# Patient Record
Sex: Female | Born: 1935 | Race: White | Hispanic: No | Smoking: Former smoker
Health system: Southern US, Community
[De-identification: ages and names within clinical notes are randomized; demographics above are authoritative.]

## PROBLEM LIST (undated history)

## (undated) DIAGNOSIS — I639 Cerebral infarction, unspecified: Secondary | ICD-10-CM

## (undated) DIAGNOSIS — Z952 Presence of prosthetic heart valve: Secondary | ICD-10-CM

## (undated) DIAGNOSIS — M353 Polymyalgia rheumatica: Secondary | ICD-10-CM

## (undated) DIAGNOSIS — E785 Hyperlipidemia, unspecified: Secondary | ICD-10-CM

## (undated) DIAGNOSIS — I509 Heart failure, unspecified: Secondary | ICD-10-CM

## (undated) DIAGNOSIS — I4891 Unspecified atrial fibrillation: Secondary | ICD-10-CM

## (undated) DIAGNOSIS — D649 Anemia, unspecified: Secondary | ICD-10-CM

## (undated) DIAGNOSIS — I739 Peripheral vascular disease, unspecified: Secondary | ICD-10-CM

## (undated) HISTORY — PX: CARDIAC VALVE REPLACEMENT: SHX585

---

## 1998-03-06 ENCOUNTER — Ambulatory Visit (HOSPITAL_COMMUNITY): Admission: RE | Admit: 1998-03-06 | Discharge: 1998-03-06 | Payer: Self-pay

## 1999-03-11 ENCOUNTER — Ambulatory Visit (HOSPITAL_COMMUNITY): Admission: RE | Admit: 1999-03-11 | Discharge: 1999-03-11 | Payer: Self-pay | Admitting: Unknown Physician Specialty

## 1999-03-11 ENCOUNTER — Encounter: Payer: Self-pay | Admitting: Unknown Physician Specialty

## 2000-04-27 ENCOUNTER — Ambulatory Visit (HOSPITAL_COMMUNITY): Admission: RE | Admit: 2000-04-27 | Discharge: 2000-04-27 | Payer: Self-pay | Admitting: Unknown Physician Specialty

## 2000-04-27 ENCOUNTER — Encounter: Payer: Self-pay | Admitting: Unknown Physician Specialty

## 2001-05-31 ENCOUNTER — Ambulatory Visit (HOSPITAL_COMMUNITY): Admission: RE | Admit: 2001-05-31 | Discharge: 2001-05-31 | Payer: Self-pay | Admitting: Unknown Physician Specialty

## 2001-05-31 ENCOUNTER — Encounter: Payer: Self-pay | Admitting: Unknown Physician Specialty

## 2002-06-10 ENCOUNTER — Ambulatory Visit (HOSPITAL_COMMUNITY): Admission: RE | Admit: 2002-06-10 | Discharge: 2002-06-10 | Payer: Self-pay

## 2002-06-10 ENCOUNTER — Encounter: Payer: Self-pay | Admitting: Internal Medicine

## 2003-07-06 ENCOUNTER — Ambulatory Visit (HOSPITAL_COMMUNITY): Admission: RE | Admit: 2003-07-06 | Discharge: 2003-07-06 | Payer: Self-pay | Admitting: Internal Medicine

## 2003-07-11 ENCOUNTER — Encounter: Admission: RE | Admit: 2003-07-11 | Discharge: 2003-07-11 | Payer: Self-pay | Admitting: Internal Medicine

## 2004-03-20 ENCOUNTER — Emergency Department: Payer: Self-pay | Admitting: Emergency Medicine

## 2004-03-20 ENCOUNTER — Other Ambulatory Visit: Payer: Self-pay

## 2004-04-11 ENCOUNTER — Ambulatory Visit: Payer: Self-pay | Admitting: Internal Medicine

## 2004-05-15 ENCOUNTER — Encounter: Admission: RE | Admit: 2004-05-15 | Discharge: 2004-05-15 | Payer: Self-pay | Admitting: Internal Medicine

## 2004-11-05 ENCOUNTER — Encounter: Admission: RE | Admit: 2004-11-05 | Discharge: 2004-11-05 | Payer: Self-pay | Admitting: Internal Medicine

## 2004-12-11 ENCOUNTER — Ambulatory Visit: Payer: Self-pay | Admitting: Surgery

## 2005-08-12 ENCOUNTER — Other Ambulatory Visit: Payer: Self-pay

## 2005-08-12 ENCOUNTER — Inpatient Hospital Stay: Payer: Self-pay | Admitting: Internal Medicine

## 2005-08-13 ENCOUNTER — Other Ambulatory Visit: Payer: Self-pay

## 2006-04-17 ENCOUNTER — Ambulatory Visit: Payer: Self-pay | Admitting: Internal Medicine

## 2006-05-15 ENCOUNTER — Ambulatory Visit: Payer: Self-pay | Admitting: Internal Medicine

## 2006-12-10 ENCOUNTER — Ambulatory Visit: Payer: Self-pay | Admitting: Internal Medicine

## 2008-01-12 ENCOUNTER — Ambulatory Visit: Payer: Self-pay | Admitting: Internal Medicine

## 2009-02-06 ENCOUNTER — Ambulatory Visit: Payer: Self-pay | Admitting: Internal Medicine

## 2010-01-24 ENCOUNTER — Ambulatory Visit: Payer: Self-pay | Admitting: Ophthalmology

## 2010-02-04 ENCOUNTER — Ambulatory Visit: Payer: Self-pay | Admitting: Ophthalmology

## 2010-04-08 ENCOUNTER — Ambulatory Visit: Payer: Self-pay | Admitting: Ophthalmology

## 2011-04-16 ENCOUNTER — Ambulatory Visit: Payer: Self-pay | Admitting: Internal Medicine

## 2014-12-27 ENCOUNTER — Emergency Department: Payer: Medicare Other

## 2014-12-27 ENCOUNTER — Emergency Department
Admission: EM | Admit: 2014-12-27 | Discharge: 2014-12-27 | Disposition: A | Discharge status: Home / Self Care | Payer: Medicare Other | Attending: Emergency Medicine | Admitting: Emergency Medicine

## 2014-12-27 ENCOUNTER — Encounter: Payer: Self-pay | Admitting: Medical Oncology

## 2014-12-27 DIAGNOSIS — W01198A Fall on same level from slipping, tripping and stumbling with subsequent striking against other object, initial encounter: Secondary | ICD-10-CM | POA: Insufficient documentation

## 2014-12-27 DIAGNOSIS — S6991XA Unspecified injury of right wrist, hand and finger(s), initial encounter: Secondary | ICD-10-CM | POA: Diagnosis not present

## 2014-12-27 DIAGNOSIS — S0083XA Contusion of other part of head, initial encounter: Secondary | ICD-10-CM

## 2014-12-27 DIAGNOSIS — S8991XA Unspecified injury of right lower leg, initial encounter: Secondary | ICD-10-CM | POA: Insufficient documentation

## 2014-12-27 DIAGNOSIS — Y9289 Other specified places as the place of occurrence of the external cause: Secondary | ICD-10-CM | POA: Insufficient documentation

## 2014-12-27 DIAGNOSIS — Y9389 Activity, other specified: Secondary | ICD-10-CM | POA: Diagnosis not present

## 2014-12-27 DIAGNOSIS — Y998 Other external cause status: Secondary | ICD-10-CM | POA: Diagnosis not present

## 2014-12-27 DIAGNOSIS — W19XXXA Unspecified fall, initial encounter: Secondary | ICD-10-CM

## 2014-12-27 DIAGNOSIS — S0990XA Unspecified injury of head, initial encounter: Secondary | ICD-10-CM | POA: Diagnosis present

## 2014-12-27 MED ORDER — ACETAMINOPHEN 325 MG PO TABS
650.0000 mg | ORAL_TABLET | Freq: Once | ORAL | Status: AC
  Administered 2014-12-27: 650 mg via ORAL

## 2014-12-27 MED ORDER — ACETAMINOPHEN 325 MG PO TABS
ORAL_TABLET | ORAL | Status: AC
  Administered 2014-12-27: 650 mg via ORAL
  Filled 2014-12-27: qty 2

## 2014-12-27 NOTE — ED Notes (Signed)
Pt was at the [facility] walking when she reports she tripped, pt has hematoma to rt side of forehead. Pt denies LOC. Pt A/O x 4. Also has skin tear to rt knee and bruise to rt hand.

## 2014-12-27 NOTE — ED Notes (Signed)
Patient transported to CT 

## 2014-12-27 NOTE — ED Provider Notes (Signed)
CSN: [id number]     Arrival date & time 12/27/14  1046 History   First MD Initiated Contact with Patient 12/27/14 1047     Chief Complaint  Patient presents with  . Fall     (Consider location/radiation/quality/duration/timing/severity/associated sxs/prior Treatment) The history is provided by the patient.  Savannah Friedman is a 81 y.o. female who presenting with fall. Patient states that she was working out at [employer] and that her left foot got stuck on something and she tripped and fell and hit her head. States that she knows that hematoma on the left forehead head as well as some pain of the right hand and right knee. Denies passing out or chest pain or syncope. She is healthy otherwise and does not take any other medications. Not on blood thinners. States that tetanus is about 10 years ago but refused another tdap shot.    History reviewed. No pertinent past medical history. Past Surgical History  Procedure Laterality Date  . Cardiac valve replacement     No family history on file. Social History  Substance Use Topics  . Smoking status: Never Smoker   . Smokeless tobacco: None  . Alcohol Use: No   OB History    No data available     Review of Systems  Skin: Positive for wound.       L forehead hematoma   All other systems reviewed and are negative.     Allergies  Codeine  Home Medications   Prior to Admission medications   Not on File   BP 159/88 mmHg  Pulse 90  Temp(Src) 97.7 F (36.5 C) (Oral)  Resp 20  Ht 5' (1.524 m)  Wt 133 lb (60.328 kg)  BMI 25.97 kg/m2  SpO2 99% Physical Exam  Constitutional: She is oriented to person, place, and time. She appears well-developed and well-nourished.  HENT:  Head: Normocephalic.  Mouth/Throat: Oropharynx is clear and moist.  L forehead hematoma. Extra ocular movements intact   Eyes: Conjunctivae are normal. Pupils are equal, round, and reactive to light.  Neck: Normal range of motion. Neck supple.   Cardiovascular: Normal rate, regular rhythm and normal heart sounds.   Pulmonary/Chest: Effort normal and breath sounds normal. No respiratory distress. She has no wheezes.  Abdominal: Soft. Bowel sounds are normal. She exhibits no distension. There is no tenderness. There is no rebound.  Musculoskeletal:  Abrasion R hand with mild tenderness. R knee skin tear with minimal tenderness, nl ROM.   Neurological: She is alert and oriented to person, place, and time.  Skin: Skin is warm and dry.  Psychiatric: She has a normal mood and affect. Her behavior is normal. Judgment and thought content normal.  Nursing note and vitals reviewed.   ED Course  Procedures (including critical care time) Labs Review Labs Reviewed - No data to display  Imaging Review Ct Head Wo Contrast  12/27/2014   CLINICAL DATA:  81 year old who tripped and fell while walking at the [facility] earlier today, sustaining a hematoma to the left forehead. Patient denies loss of consciousness. Initial encounter.  EXAM: CT HEAD WITHOUT CONTRAST  CT CERVICAL SPINE WITHOUT CONTRAST  TECHNIQUE: Multidetector CT imaging of the head and cervical spine was performed following the standard protocol without intravenous contrast. Multiplanar CT image reconstructions of the cervical spine were also generated.  COMPARISON:  None.  FINDINGS: CT HEAD FINDINGS  Moderate to severe age related cortical and deep atrophy. Mild cerebellar atrophy. Moderate changes of small vessel disease  of the white matter diffusely. Extensive calcification involving the choroid plexus of the lateral ventricles including the temporal horns, and involving the choroid plexus of the 4th ventricle extending into the foramina of Luschka. Physiologic calcifications in the basal ganglia bilaterally. No mass lesion. No midline shift. No acute hemorrhage or hematoma. No extra-axial fluid collections. No evidence of acute infarction.  Left frontal scalp hematoma and preseptal hematoma  anterior to the left orbit without underlying skull fracture. Visualized paranasal sinuses, bilateral mastoid air cells and bilateral middle ear cavities well-aerated. Severe bilateral carotid siphon atherosclerosis.  CT CERVICAL SPINE FINDINGS  No fractures identified involving the cervical spine. Severe osseous demineralization. Fusion of the vertebral bodies of C5 and C6. Anatomic posterior alignment on the sagittal reconstructed images. Facet joints intact throughout with severe diffuse degenerative changes. Disc space narrowing at C2-3, C4-5, and C6-7. Borderline spinal stenosis at C5-6. Coronal reformatted images demonstrate an intact craniocervical junction, intact C1-C2 articulation with degenerative changes, and intact lateral masses throughout. Uncinate and predominantly facet hypertrophy account for multilevel foraminal stenoses including severe left and moderate right C3-4, severe bilateral C4-5, moderate bilateral C5-6, moderate bilateral C6-7.  Note is made of mosaic attenuation involving the visualized lung apices.  IMPRESSION: 1. No acute intracranial abnormality. 2. Left frontal scalp hematoma without underlying skull fracture. 3. Moderate to severe age related generalized atrophy and moderate chronic microvascular ischemic changes of the white matter. 4. No cervical spine fractures identified. 5. Severe osseous demineralization of the cervical spine. 6. Multilevel degenerative disc disease, spondylosis and facet degenerative changes with multilevel foraminal stenoses as detailed above. 7. Fusion of the C5 and C6 vertebral bodies with borderline spinal stenosis at the C5-6 level. 8. Mosaic attenuation involving the visualized lung apices likely representing focal air trapping as is seen in patients with bronchitis and/or asthma.   Electronically Signed   By: Hulan Saas M.D.   On: 12/27/2014 12:06   Ct Cervical Spine Wo Contrast  12/27/2014   CLINICAL DATA:  81 year old who tripped and fell  while walking at the [facility] earlier today, sustaining a hematoma to the left forehead. Patient denies loss of consciousness. Initial encounter.  EXAM: CT HEAD WITHOUT CONTRAST  CT CERVICAL SPINE WITHOUT CONTRAST  TECHNIQUE: Multidetector CT imaging of the head and cervical spine was performed following the standard protocol without intravenous contrast. Multiplanar CT image reconstructions of the cervical spine were also generated.  COMPARISON:  None.  FINDINGS: CT HEAD FINDINGS  Moderate to severe age related cortical and deep atrophy. Mild cerebellar atrophy. Moderate changes of small vessel disease of the white matter diffusely. Extensive calcification involving the choroid plexus of the lateral ventricles including the temporal horns, and involving the choroid plexus of the 4th ventricle extending into the foramina of Luschka. Physiologic calcifications in the basal ganglia bilaterally. No mass lesion. No midline shift. No acute hemorrhage or hematoma. No extra-axial fluid collections. No evidence of acute infarction.  Left frontal scalp hematoma and preseptal hematoma anterior to the left orbit without underlying skull fracture. Visualized paranasal sinuses, bilateral mastoid air cells and bilateral middle ear cavities well-aerated. Severe bilateral carotid siphon atherosclerosis.  CT CERVICAL SPINE FINDINGS  No fractures identified involving the cervical spine. Severe osseous demineralization. Fusion of the vertebral bodies of C5 and C6. Anatomic posterior alignment on the sagittal reconstructed images. Facet joints intact throughout with severe diffuse degenerative changes. Disc space narrowing at C2-3, C4-5, and C6-7. Borderline spinal stenosis at C5-6. Coronal reformatted images demonstrate an intact craniocervical junction,  intact C1-C2 articulation with degenerative changes, and intact lateral masses throughout. Uncinate and predominantly facet hypertrophy account for multilevel foraminal stenoses including  severe left and moderate right C3-4, severe bilateral C4-5, moderate bilateral C5-6, moderate bilateral C6-7.  Note is made of mosaic attenuation involving the visualized lung apices.  IMPRESSION: 1. No acute intracranial abnormality. 2. Left frontal scalp hematoma without underlying skull fracture. 3. Moderate to severe age related generalized atrophy and moderate chronic microvascular ischemic changes of the white matter. 4. No cervical spine fractures identified. 5. Severe osseous demineralization of the cervical spine. 6. Multilevel degenerative disc disease, spondylosis and facet degenerative changes with multilevel foraminal stenoses as detailed above. 7. Fusion of the C5 and C6 vertebral bodies with borderline spinal stenosis at the C5-6 level. 8. Mosaic attenuation involving the visualized lung apices likely representing focal air trapping as is seen in patients with bronchitis and/or asthma.   Electronically Signed   By: Hulan Saas M.D.   On: 12/27/2014 12:06   Dg Knee Complete 4 Views Right  12/27/2014   CLINICAL DATA:  Anterior knee pain with swelling and bruising and abrasion secondary to a fall today.  EXAM: RIGHT KNEE - COMPLETE 4+ VIEW  COMPARISON:  None  FINDINGS: There is no fracture or dislocation. Slight medial joint space narrowing. Soft tissue swelling anterior to the lower pole of the patella. Small joint effusion.  IMPRESSION: No acute osseous abnormality. Soft tissue swelling. Small joint effusion.   Electronically Signed   By: Francene Boyers M.D.   On: 12/27/2014 11:32   Dg Hand Complete Right  12/27/2014   CLINICAL DATA:  81 year old who fell from a standing position onto a concrete floor and injured the right hand. Bruising and abrasion at the level of the 1st metacarpal.  EXAM: RIGHT HAND - COMPLETE 3+ VIEW  COMPARISON:  None.  FINDINGS: A ring obscures the mid and distal shaft of the proximal phalanx of the ring finger. No evidence of acute fracture or dislocation. Osseous  demineralization. Severe narrowing of IP joint spaces in the fingers and the thumb, worst at the PIP joint of the index finger where there is mild deformity. Narrowed joint spaces in the wrist, particularly the trapezium-1st metacarpal joint. Hydroxyapatite deposition disease adjacent to the trapezium-1st metacarpal joint.  IMPRESSION: 1. No acute osseous abnormality. 2. Severe osteoarthritis involving the IP joints of the fingers and thumbs, greatest at the PIP joint of the ring finger.   Electronically Signed   By: Hulan Saas M.D.   On: 12/27/2014 11:33   I have personally reviewed and evaluated these images and lab results as part of my medical decision-making.   EKG Interpretation None      MDM   Final diagnoses:  None    Savannah Friedman is a 81 y.o. female here with mechanical fall. Will get CT head/neck, xrays. No syncope or chest pain so will not need labs or EKG.  12:49 PM CT showed no fracture or bleed. Will dc home.       Richardean Canal, MD 12/27/14 1250

## 2014-12-27 NOTE — Discharge Instructions (Signed)
You are likely to have more bruising on the forehead. The skin will turn black and blue.   Stay hydrated.   See your doctor.   Return to ER if you have severe pain, vomiting.

## 2015-01-20 ENCOUNTER — Emergency Department
Admission: EM | Admit: 2015-01-20 | Discharge: 2015-01-20 | Disposition: A | Discharge status: Home / Self Care | Payer: Medicare Other | Attending: Emergency Medicine | Admitting: Emergency Medicine

## 2015-01-20 ENCOUNTER — Emergency Department: Payer: Medicare Other

## 2015-01-20 DIAGNOSIS — R220 Localized swelling, mass and lump, head: Secondary | ICD-10-CM | POA: Diagnosis present

## 2015-01-20 DIAGNOSIS — S0083XD Contusion of other part of head, subsequent encounter: Secondary | ICD-10-CM | POA: Diagnosis not present

## 2015-01-20 DIAGNOSIS — W1839XD Other fall on same level, subsequent encounter: Secondary | ICD-10-CM | POA: Diagnosis not present

## 2015-01-20 NOTE — ED Notes (Signed)
States she fell about 3 weeks ago  Hit her face   conts to have bruising and swelling to face. Denies new injury

## 2015-01-20 NOTE — ED Notes (Signed)
Pt reported a fall on Oct 5, was seen in ED. Pt injured left side of face and is concerned that the swelling and bruising has not improved. Denies any additional falls.

## 2015-01-20 NOTE — Discharge Instructions (Signed)
Hematoma  A hematoma is a collection of blood. The collection of blood can turn into a hard, painful lump under the skin. Your skin may turn blue or yellow if the hematoma is close to the surface of the skin. Most hematomas get better in a few days to weeks. Some hematomas are serious and need medical care. Hematomas can be very small or very big.  HOME CARE   Apply ice to the injured area:    Put ice in a plastic bag.    Place a towel between your skin and the bag.    Leave the ice on for 20 minutes, 2-3 times a day for the first 1 to 2 days.   After the first 2 days, switch to using warm packs on the injured area.   Raise (elevate) the injured area to lessen pain and puffiness (swelling). You may also wrap the area with an elastic bandage. Make sure the bandage is not wrapped too tight.   If you have a painful hematoma on your leg or foot, you may use crutches for a couple days.   Only take medicines as told by your doctor.  GET HELP RIGHT AWAY IF:    Your pain gets worse.   Your pain is not controlled with medicine.   You have a fever.   Your puffiness gets worse.   Your skin turns more blue or yellow.   Your skin over the hematoma breaks or starts bleeding.   Your hematoma is in your chest or belly (abdomen) and you are short of breath, feel weak, or have a change in consciousness.   Your hematoma is on your scalp and you have a headache that gets worse or a change in alertness or consciousness.  MAKE SURE YOU:    Understand these instructions.   Will watch your condition.   Will get help right away if you are not doing well or get worse.     This information is not intended to replace advice given to you by your health care provider. Make sure you discuss any questions you have with your health care provider.     Document Released: 04/17/2004 Document Revised: 11/10/2012 Document Reviewed: 08/18/2012  Elsevier Interactive Patient Education 2016 [employer]

## 2015-01-20 NOTE — ED Notes (Signed)
Pt verbalizes understanding of discharge instructions.

## 2015-01-20 NOTE — ED Provider Notes (Signed)
CSN: [id number]     Arrival date & time 01/20/15  1011 History   First MD Initiated Contact with Patient 01/20/15 1044     Chief Complaint  Patient presents with  . Facial Swelling      HPI Comments: 81 year old female presents today complaining of persistent left sided facial swelling secondary to a fall that occurred on 12/27/14. Pt reports on that date she was at the [facility] and had a mechanical fall, injuring her right hand, knee and bruising the left side of her face/forehead. She was evaluated on 12/27/14 in the ER by Dr. Silverio Lay and had a negative CT of her head/neck. She is concerned she may have a blood clot in her face. Has not had any headache or dizziness, area of hematoma is tender.   No past medical history on file. Past Surgical History  Procedure Laterality Date  . Cardiac valve replacement     History reviewed. No pertinent family history. Social History  Substance Use Topics  . Smoking status: Never Smoker   . Smokeless tobacco: None  . Alcohol Use: No   OB History    No data available     Review of Systems  Skin: Positive for wound.  All other systems reviewed and are negative.     Allergies  Codeine  Home Medications   Prior to Admission medications   Not on File   BP 160/87 mmHg  Pulse 85  Temp(Src) 97.5 F (36.4 C) (Oral)  Ht 5' (1.524 m)  Wt 133 lb (60.328 kg)  BMI 25.97 kg/m2  SpO2 98% Physical Exam  Constitutional: She is oriented to person, place, and time. Vital signs are normal. She appears well-developed and well-nourished. She is active.  Non-toxic appearance. She does not have a sickly appearance. She does not appear ill.  HENT:  Head: Normocephalic.  Right Ear: Tympanic membrane and external ear normal.  Left Ear: Tympanic membrane and external ear normal.  Nose: Nose normal.  Mouth/Throat: Uvula is midline, oropharynx is clear and moist and mucous membranes are normal.  Left forehead with small hematoma. Left eye and cheek there is  ecchymosis, no swelling  Eyes: Conjunctivae and EOM are normal. Pupils are equal, round, and reactive to light.  Left orbital floor TTP  Neck: Normal range of motion. Neck supple.  No cervical spine tenderness.  Neurological: She is alert and oriented to person, place, and time. No cranial nerve deficit. She exhibits normal muscle tone. Coordination normal.  Skin: Skin is warm and dry.  Psychiatric: She has a normal mood and affect. Her behavior is normal. Judgment and thought content normal.  Nursing note and vitals reviewed.   ED Course  Procedures (including critical care time) Labs Review Labs Reviewed - No data to display  Imaging Review Ct Maxillofacial Wo Cm  01/20/2015  CLINICAL DATA:  Fall with injury to LEFT side of face on January 06, 2015. EXAM: CT MAXILLOFACIAL WITHOUT CONTRAST TECHNIQUE: Multidetector CT imaging of the maxillofacial structures was performed. Multiplanar CT image reconstructions were also generated. A small metallic BB was placed on the right temple in order to reliably differentiate right from left. COMPARISON:  Head CT 12/27/2014 FINDINGS: The orbital walls are intact. The intraconal contents are normal. Globes are normal. No nasal bone fracture. No fluid frontal sinus. The zygomatic arches are intact. No fracture of the walls of the maxillary sinus. The pterygoid plates are intact. The mandibular condyles are located. No mandibular fracture no fluid in the  paranasal sinuses. IMPRESSION: No facial bone fracture. Electronically Signed   By: Genevive BiStewart  Edmunds M.D.   On: 01/20/2015 11:08   I have personally reviewed and evaluated these images and lab results as part of my medical decision-making.   EKG Interpretation None      MDM  Reviewed notes from evaluation on 12/27/14 - negative CT of head and cervical spine. Due to orbital floor tenderness concerned for possible facial fracture. Will eval with CT of facial bone. Reassured pt I am not concerned she has a  blood clot, likely has persistent hematoma.  Negative CT facial bones. Discussed with patient she would likely have swelling for a few more weeks. Continue supportive care with ice, motrin  Final diagnoses:  Traumatic hematoma of forehead, subsequent encounter        Luvenia Reddenmma Weavil V, PA-C 01/20/15 1116  Phineas SemenGraydon Goodman, MD 01/20/15 1133

## 2015-02-17 ENCOUNTER — Inpatient Hospital Stay: Admit: 2015-02-17 | Payer: Medicare Other

## 2015-02-17 ENCOUNTER — Encounter: Payer: Self-pay | Admitting: Emergency Medicine

## 2015-02-17 ENCOUNTER — Inpatient Hospital Stay
Admission: EM | Admit: 2015-02-17 | Discharge: 2015-02-23 | DRG: 193 | Disposition: A | Discharge status: Skilled Nursing Facility | Payer: Medicare Other | Attending: Internal Medicine | Admitting: Internal Medicine

## 2015-02-17 ENCOUNTER — Emergency Department: Payer: Medicare Other

## 2015-02-17 DIAGNOSIS — G959 Disease of spinal cord, unspecified: Secondary | ICD-10-CM | POA: Diagnosis present

## 2015-02-17 DIAGNOSIS — D696 Thrombocytopenia, unspecified: Secondary | ICD-10-CM | POA: Diagnosis present

## 2015-02-17 DIAGNOSIS — Z79899 Other long term (current) drug therapy: Secondary | ICD-10-CM | POA: Diagnosis not present

## 2015-02-17 DIAGNOSIS — W19XXXA Unspecified fall, initial encounter: Secondary | ICD-10-CM | POA: Diagnosis present

## 2015-02-17 DIAGNOSIS — J9601 Acute respiratory failure with hypoxia: Secondary | ICD-10-CM | POA: Diagnosis present

## 2015-02-17 DIAGNOSIS — Z952 Presence of prosthetic heart valve: Secondary | ICD-10-CM | POA: Diagnosis not present

## 2015-02-17 DIAGNOSIS — J209 Acute bronchitis, unspecified: Secondary | ICD-10-CM | POA: Diagnosis present

## 2015-02-17 DIAGNOSIS — I340 Nonrheumatic mitral (valve) insufficiency: Secondary | ICD-10-CM | POA: Diagnosis present

## 2015-02-17 DIAGNOSIS — M542 Cervicalgia: Secondary | ICD-10-CM

## 2015-02-17 DIAGNOSIS — I5031 Acute diastolic (congestive) heart failure: Secondary | ICD-10-CM | POA: Diagnosis present

## 2015-02-17 DIAGNOSIS — Z7901 Long term (current) use of anticoagulants: Secondary | ICD-10-CM | POA: Diagnosis not present

## 2015-02-17 DIAGNOSIS — G8191 Hemiplegia, unspecified affecting right dominant side: Secondary | ICD-10-CM | POA: Diagnosis present

## 2015-02-17 DIAGNOSIS — M25569 Pain in unspecified knee: Secondary | ICD-10-CM

## 2015-02-17 DIAGNOSIS — J189 Pneumonia, unspecified organism: Principal | ICD-10-CM | POA: Diagnosis present

## 2015-02-17 DIAGNOSIS — G20 Parkinson's disease: Secondary | ICD-10-CM | POA: Diagnosis present

## 2015-02-17 DIAGNOSIS — Z7982 Long term (current) use of aspirin: Secondary | ICD-10-CM | POA: Diagnosis not present

## 2015-02-17 DIAGNOSIS — G8929 Other chronic pain: Secondary | ICD-10-CM | POA: Diagnosis present

## 2015-02-17 DIAGNOSIS — I6522 Occlusion and stenosis of left carotid artery: Secondary | ICD-10-CM | POA: Diagnosis present

## 2015-02-17 DIAGNOSIS — I639 Cerebral infarction, unspecified: Secondary | ICD-10-CM | POA: Diagnosis present

## 2015-02-17 DIAGNOSIS — R05 Cough: Secondary | ICD-10-CM

## 2015-02-17 DIAGNOSIS — J45909 Unspecified asthma, uncomplicated: Secondary | ICD-10-CM | POA: Diagnosis present

## 2015-02-17 DIAGNOSIS — M25551 Pain in right hip: Secondary | ICD-10-CM | POA: Diagnosis present

## 2015-02-17 DIAGNOSIS — M549 Dorsalgia, unspecified: Secondary | ICD-10-CM | POA: Diagnosis present

## 2015-02-17 DIAGNOSIS — R531 Weakness: Secondary | ICD-10-CM

## 2015-02-17 DIAGNOSIS — D649 Anemia, unspecified: Secondary | ICD-10-CM | POA: Diagnosis present

## 2015-02-17 DIAGNOSIS — I6529 Occlusion and stenosis of unspecified carotid artery: Secondary | ICD-10-CM

## 2015-02-17 DIAGNOSIS — Z885 Allergy status to narcotic agent status: Secondary | ICD-10-CM

## 2015-02-17 DIAGNOSIS — R059 Cough, unspecified: Secondary | ICD-10-CM

## 2015-02-17 HISTORY — DX: Anemia, unspecified: D64.9

## 2015-02-17 LAB — URINALYSIS COMPLETE WITH MICROSCOPIC (ARMC ONLY)
BILIRUBIN URINE: NEGATIVE
Glucose, UA: NEGATIVE mg/dL
Hgb urine dipstick: NEGATIVE
Leukocytes, UA: NEGATIVE
Nitrite: NEGATIVE
PH: 6 (ref 5.0–8.0)
PROTEIN: 30 mg/dL — AB
Specific Gravity, Urine: 1.021 (ref 1.005–1.030)

## 2015-02-17 LAB — CBC WITH DIFFERENTIAL/PLATELET
Basophils Absolute: 0 10*3/uL (ref 0–0.1)
Basophils Relative: 0 %
EOS PCT: 0 %
Eosinophils Absolute: 0 10*3/uL (ref 0–0.7)
HCT: 39.7 % (ref 35.0–47.0)
Hemoglobin: 13.3 g/dL (ref 12.0–16.0)
LYMPHS ABS: 0.3 10*3/uL — AB (ref 1.0–3.6)
LYMPHS PCT: 8 %
MCH: 31.3 pg (ref 26.0–34.0)
MCHC: 33.5 g/dL (ref 32.0–36.0)
MCV: 93.6 fL (ref 80.0–100.0)
MONO ABS: 0.3 10*3/uL (ref 0.2–0.9)
MONOS PCT: 9 %
NEUTROS ABS: 3.1 10*3/uL (ref 1.4–6.5)
Neutrophils Relative %: 83 %
PLATELETS: 137 10*3/uL — AB (ref 150–440)
RBC: 4.24 MIL/uL (ref 3.80–5.20)
RDW: 13.7 % (ref 11.5–14.5)
WBC: 3.8 10*3/uL (ref 3.6–11.0)

## 2015-02-17 LAB — COMPREHENSIVE METABOLIC PANEL
ALBUMIN: 4 g/dL (ref 3.5–5.0)
ALK PHOS: 58 U/L (ref 38–126)
ALT: 10 U/L — AB (ref 14–54)
ANION GAP: 9 (ref 5–15)
AST: 22 U/L (ref 15–41)
BUN: 18 mg/dL (ref 6–20)
CALCIUM: 8.4 mg/dL — AB (ref 8.9–10.3)
CHLORIDE: 100 mmol/L — AB (ref 101–111)
CO2: 26 mmol/L (ref 22–32)
Creatinine, Ser: 0.73 mg/dL (ref 0.44–1.00)
GFR calc Af Amer: >60 mL/min (ref >60)
GFR calc non Af Amer: >60 mL/min (ref >60)
GLUCOSE: 110 mg/dL — AB (ref 65–99)
Potassium: 3.8 mmol/L (ref 3.5–5.1)
SODIUM: 135 mmol/L (ref 135–145)
Total Bilirubin: 1 mg/dL (ref 0.3–1.2)
Total Protein: 6.5 g/dL (ref 6.5–8.1)

## 2015-02-17 LAB — PHOSPHORUS: PHOSPHORUS: 3 mg/dL (ref 2.5–4.6)

## 2015-02-17 LAB — LIPASE, BLOOD: Lipase: 31 U/L (ref 11–51)

## 2015-02-17 LAB — TROPONIN I

## 2015-02-17 LAB — MAGNESIUM: Magnesium: 1.8 mg/dL (ref 1.7–2.4)

## 2015-02-17 MED ORDER — ONDANSETRON HCL 4 MG PO TABS: 4.0000 mg | ORAL_TABLET | Freq: Four times a day (QID) | ORAL | Status: DC | PRN

## 2015-02-17 MED ORDER — CEFTRIAXONE SODIUM 1 G IJ SOLR
1.0000 g | Freq: Once | INTRAMUSCULAR | Status: AC
  Administered 2015-02-17: 1 g via INTRAVENOUS
  Filled 2015-02-17: qty 10

## 2015-02-17 MED ORDER — DEXTROSE 5 % IV SOLN
500.0000 mg | Freq: Once | INTRAVENOUS | Status: AC
  Administered 2015-02-17: 500 mg via INTRAVENOUS
  Filled 2015-02-17: qty 500

## 2015-02-17 MED ORDER — LEVOFLOXACIN IN D5W 500 MG/100ML IV SOLN
500.0000 mg | Freq: Once | INTRAVENOUS | Status: AC
  Administered 2015-02-18: 500 mg via INTRAVENOUS
  Filled 2015-02-17: qty 100

## 2015-02-17 MED ORDER — SODIUM CHLORIDE 0.9 % IV BOLUS (SEPSIS)
1000.0000 mL | Freq: Once | INTRAVENOUS | Status: AC
  Administered 2015-02-17: 1000 mL via INTRAVENOUS

## 2015-02-17 MED ORDER — ENOXAPARIN SODIUM 40 MG/0.4ML ~~LOC~~ SOLN
40.0000 mg | SUBCUTANEOUS | Status: DC
  Administered 2015-02-17: 40 mg via SUBCUTANEOUS
  Filled 2015-02-17: qty 0.4

## 2015-02-17 MED ORDER — ACETAMINOPHEN 650 MG RE SUPP: 650.0000 mg | Freq: Four times a day (QID) | RECTAL | Status: DC | PRN

## 2015-02-17 MED ORDER — FUROSEMIDE 10 MG/ML IJ SOLN
40.0000 mg | Freq: Once | INTRAMUSCULAR | Status: AC
  Administered 2015-02-17: 40 mg via INTRAVENOUS
  Filled 2015-02-17: qty 4

## 2015-02-17 MED ORDER — IPRATROPIUM-ALBUTEROL 0.5-2.5 (3) MG/3ML IN SOLN
3.0000 mL | RESPIRATORY_TRACT | Status: DC
  Administered 2015-02-17 – 2015-02-20 (×14): 3 mL via RESPIRATORY_TRACT
  Filled 2015-02-17 (×17): qty 3

## 2015-02-17 MED ORDER — ONDANSETRON HCL 4 MG/2ML IJ SOLN
4.0000 mg | Freq: Four times a day (QID) | INTRAMUSCULAR | Status: DC | PRN
  Administered 2015-02-20: 4 mg via INTRAVENOUS
  Filled 2015-02-17: qty 2

## 2015-02-17 MED ORDER — BISACODYL 5 MG PO TBEC
5.0000 mg | DELAYED_RELEASE_TABLET | Freq: Every day | ORAL | Status: DC | PRN
  Administered 2015-02-20 – 2015-02-21 (×2): 5 mg via ORAL
  Filled 2015-02-17 (×2): qty 1

## 2015-02-17 MED ORDER — HYDROCODONE-ACETAMINOPHEN 5-325 MG PO TABS
1.0000 | ORAL_TABLET | ORAL | Status: DC | PRN
  Administered 2015-02-19: 1 via ORAL
  Administered 2015-02-19 – 2015-02-20 (×2): 2 via ORAL
  Administered 2015-02-20: 1 via ORAL
  Administered 2015-02-20 (×2): 2 via ORAL
  Administered 2015-02-22 (×2): 1 via ORAL
  Administered 2015-02-23 (×2): 2 via ORAL
  Filled 2015-02-17: qty 1
  Filled 2015-02-17 (×7): qty 2
  Filled 2015-02-17 (×2): qty 1

## 2015-02-17 MED ORDER — FUROSEMIDE 10 MG/ML IJ SOLN
20.0000 mg | Freq: Two times a day (BID) | INTRAMUSCULAR | Status: DC
  Administered 2015-02-18: 20 mg via INTRAVENOUS
  Filled 2015-02-17: qty 2

## 2015-02-17 MED ORDER — ALUM & MAG HYDROXIDE-SIMETH 200-200-20 MG/5ML PO SUSP
30.0000 mL | Freq: Four times a day (QID) | ORAL | Status: DC | PRN
  Administered 2015-02-21 – 2015-02-23 (×4): 30 mL via ORAL
  Filled 2015-02-17 (×4): qty 30

## 2015-02-17 MED ORDER — SENNOSIDES-DOCUSATE SODIUM 8.6-50 MG PO TABS: 1.0000 | ORAL_TABLET | Freq: Every evening | ORAL | Status: DC | PRN

## 2015-02-17 MED ORDER — ACETAMINOPHEN 325 MG PO TABS
650.0000 mg | ORAL_TABLET | Freq: Four times a day (QID) | ORAL | Status: DC | PRN
  Administered 2015-02-19 – 2015-02-20 (×2): 650 mg via ORAL
  Filled 2015-02-17 (×3): qty 2

## 2015-02-17 MED ORDER — LEVOFLOXACIN IN D5W 250 MG/50ML IV SOLN
250.0000 mg | INTRAVENOUS | Status: AC
  Administered 2015-02-19 – 2015-02-21 (×3): 250 mg via INTRAVENOUS
  Filled 2015-02-17 (×3): qty 50

## 2015-02-17 NOTE — Progress Notes (Signed)
ANTIBIOTIC CONSULT NOTE - INITIAL  Pharmacy Consult for Levaquin Indication: pneumonia  Allergies  Allergen Reactions  . Codeine Nausea And Vomiting    Patient Measurements: Height: 5\' 1"  (154.9 cm) Weight: 136 lb 8 oz (61.916 kg) IBW/kg (Calculated) : 47.8 Adjusted Body Weight:   Vital Signs: Temp: 98.5 F (36.9 C) (11/26 1045) Temp Source: Oral (11/26 1045) BP: 118/57 mmHg (11/26 1430) Pulse Rate: 94 (11/26 1430) Intake/Output from previous day:   Intake/Output from this shift:    Labs:  Recent Labs  02/17/15 1048  WBC 3.8  HGB 13.3  PLT 137*  CREATININE 0.73   Estimated Creatinine Clearance: 40.2 mL/min (by C-G formula based on Cr of 0.73). No results for input(s): VANCOTROUGH, VANCOPEAK, VANCORANDOM, GENTTROUGH, GENTPEAK, GENTRANDOM, TOBRATROUGH, TOBRAPEAK, TOBRARND, AMIKACINPEAK, AMIKACINTROU, AMIKACIN in the last 72 hours.   Microbiology: No results found for this or any previous visit (from the past 720 hour(s)).  Medical History: History reviewed. No pertinent past medical history.  Medications:  No prescriptions prior to admission   Assessment: CrCl = 40.2 ml/min Azithromycin 500 mg IV X 1 given on 11/26 @ 16:00.  Goal of Therapy:  resolution of infection  Plan:  Expected duration 7 days with resolution of temperature and/or normalization of WBC  Azithromycin 500 mg IV X 1 given on 11/26 @ 16:00. Will order levaquin 500 mg IV X 1 to be given on 11/27 @ 10:00 (due to risk of QT prolongation). Will order levaquin 250 mg IV Q24H to start 11/28.   Brad Lieurance D 02/17/2015,4:15 PM

## 2015-02-17 NOTE — H&P (Signed)
[facility] - [place] at [facility]   PATIENT NAME: Savannah Friedman    MR#:  [id number]  DATE OF BIRTH:  1923-11-22  DATE OF ADMISSION:  02/17/2015  PRIMARY CARE PHYSICIAN: Curtis Sites III, MD   REQUESTING/REFERRING PHYSICIAN: Dr Scotty Court  CHIEF COMPLAINT:   Shortness of breath HISTORY OF PRESENT ILLNESS:  Savannah Friedman  is a 81 y.o. female with no past medical history presents with above complaint. Patient reports over the past 3 days she's been having increasing shortness of breath with a thick cough with purulent sputum. She denies wheezing or dyspnea exertion. She denies lower extremity edema. She denies recent travel or sick contacts. She denies fever or chills. In the emergency room chest x-ray shows interstitial edema. She is given antibiotics for presumed community-acquired pneumonia. She is also found to be hypoxic on room air.  PAST MEDICAL HISTORY:  No past medical history  PAST SURGICAL HISTORY:   Past Surgical History  Procedure Laterality Date  . Cardiac valve replacement mitral valve       SOCIAL HISTORY:   Social History  Substance Use Topics  . Smoking status: Never Smoker   . Smokeless tobacco: Not on file  . Alcohol Use: No    FAMILY HISTORY:  No family history of CAD, hypertension or diabetes  DRUG ALLERGIES:   Allergies  Allergen Reactions  . Codeine Nausea Only     REVIEW OF SYSTEMS:  CONSTITUTIONAL: No fever, +++fatigue ++++generalized weakness.  EYES: No blurred or double vision.  EARS, NOSE, AND THROAT: No tinnitus or ear pain.  RESPIRATORY: ++ cough and shortness of breath, NO wheezing or hemoptysis.  CARDIOVASCULAR: No chest pain, orthopnea, edema.  GASTROINTESTINAL: No nausea, vomiting, diarrhea or abdominal pain.  GENITOURINARY: No dysuria, hematuria.  ENDOCRINE: No polyuria, nocturia,  HEMATOLOGY: No anemia, easy bruising or bleeding SKIN: No rash or lesion. MUSCULOSKELETAL: No joint pain or arthritis.    NEUROLOGIC: No tingling, numbness, weakness.  PSYCHIATRY: No anxiety or depression.   MEDICATIONS AT HOME:  none   VITAL SIGNS:  Blood pressure 93/48, pulse 84, temperature 98.5 F (36.9 C), temperature source Oral, resp. rate 20, height  (1.549 m), weight 61.916 kg (136 lb 8 oz), SpO2 96 %.  PHYSICAL EXAMINATION:  GENERAL:  81 y.o.-year-old patient lying in the bed with no acute distress.  EYES: Pupils equal, round, reactive to light and accommodation. No scleral icterus. Extraocular muscles intact.  HEENT: Head atraumatic, normocephalic. Oropharynx and nasopharynx clear.  NECK:  Supple, no jugular venous distention. No thyroid enlargement, no tenderness.  LUNGS: She has rhonchorous sounds bilaterally with no wheezing or rales. CARDIOVASCULAR: S1, S2 normal. No murmurs, rubs, or gallops.  ABDOMEN: Soft, nontender, nondistended. Bowel sounds present. No organomegaly or mass.  EXTREMITIES: No pedal edema, cyanosis, or clubbing.  NEUROLOGIC: Cranial nerves II through XII are grossly intact. No focal deficits. PSYCHIATRIC: The patient is alert and oriented x 3.  SKIN: No obvious rash, lesion, or ulcer.   LABORATORY PANEL:   CBC  Recent Labs Lab 02/17/15 1048  WBC 3.8  HGB 13.3  HCT 39.7  PLT 137*   ------------------------------------------------------------------------------------------------------------------  Chemistries   Recent Labs Lab 02/17/15 1048  NA 135  K 3.8  CL 100*  CO2 26  GLUCOSE 110*  BUN 18  CREATININE 0.73  CALCIUM 8.4*  MG 1.8  AST 22  ALT 10*  ALKPHOS 58  BILITOT 1.0   ------------------------------------------------------------------------------------------------------------------  Cardiac Enzymes  Recent Labs Lab 02/17/15 1048  TROPONINI <0.03   ------------------------------------------------------------------------------------------------------------------  RADIOLOGY:  Dg Chest 2 View  02/17/2015  CLINICAL DATA:   Generalized weakness.  Hypoxia with cough. EXAM: CHEST  2 VIEW COMPARISON:  None. FINDINGS: There is a prosthetic heart valve, likely a mitral valve. Prominent interstitial lung markings could represent chronic changes versus mild interstitial edema. There are epicardial pacer wires in the chest. No large pleural effusions. Heart size is within normal limits. IMPRESSION: Prominent interstitial lung markings are concerning for mild interstitial edema. Electronically Signed   By: Richarda OverlieAdam  Henn M.D.   On: 02/17/2015 13:17    EKG:     IMPRESSION AND PLAN:    81 year old very pleasant female with no past medical history who presents with shortness of breath and acute hypoxic respiratory failure.   1. Acute hypoxic respiratory failure: This is likely due to an underlying pneumonia as per symptoms. Chest x-ray showing some interstitial edema. I will order echocardiogram to evaluate for congestive heart failure. I will start Levaquin for community-acquired pneumonia. Blood cultures were already ordered in the emergency room. Continue to wean oxygen as tolerated.  2. Community-acquired pneumonia: I have started Levaquin and blood cultures were ordered in the emergency room.      All the records are reviewed and case discussed with ED provider. Management plans discussed with the patient and she is in agreement.  CODE STATUS: FULL  TOTAL TIME TAKING CARE OF THIS PATIENT: 45 minutes.    Milisa Kimbell M.D on 02/17/2015 at 1:57 PM  Between 7am to 6pm - Pager - [phone] After 6pm go to [email or web address] - password [email or web address] [facility]  [facility]  Office  [phone]  CC: Primary care physician; Lynnea FerrierBERT J KLEIN III, MD

## 2015-02-17 NOTE — Progress Notes (Signed)
02/17/2015 6:27 PM  Pt exhibiting more adventitious breath sounds, and significantly desaturated and had an increase in pulse as high as 127bpm.  Notified Mody, MD.  Dr Juliene PinaMody is ordering Lasix and duoneb breathing treatments for the pt.  Temperature was 102.1 oral, and will be treated with Tylenol.  Will continue to monitor and assess.  Bradly Chrisougherty, Nidia Grogan E, RN

## 2015-02-17 NOTE — ED Notes (Signed)
Pt to ED via EMS from home c/o generalized weakness. Pt states that she slid out of bed this morning to the floor around 0700 and took 2 hours to crawl to her MedAlert to call for help. Pt also has c/o cold since Monday with cough productive of yellow sputum. Denies LOC, denies using blood thinners.

## 2015-02-17 NOTE — Progress Notes (Signed)
02/17/2015 16:30  New Admission Note:   Arrival Method: stretcher Mental Orientation: Alert and Oriented x 4 Telemetry: Yes Assessment: Completed Skin: intact, scattered eccymoses IV: right wrist and left AC Pain: denies Tubes: none Safety Measures: high fall risk due to recent falls, Safety Fall Prevention Plan has been given, discussed. Admission: Completed Unit Orientation: Patient has been orientated to the room, unit and staff.  Family: son and grandson at the bedside  Orders have been reviewed and implemented. Will continue to monitor the patient. Call light has been placed within reach and bed alarm has been activated.   Bradly Chrisougherty, Sabriya Yono E

## 2015-02-17 NOTE — ED Provider Notes (Signed)
[facility] Emergency Department Provider Note  ____________________________________________  Time seen: 12:15 PM  I have reviewed the triage vital signs and the nursing notes.   HISTORY  Chief Complaint Weakness    HPI Savannah Friedman is a 81 y.o. female who complains of generalized weakness today. She only walks without assistance or occasionally with a cane, but today she was so weak that she slid out of bed to the floor and was unable to get up. It took her 2 hours to crawl to her med alert device to get help. Denies any acute pain other than the weakness. She does have some shortness of breath and a productive cough for 2 days. She recently had a upper respiratory infection.     History reviewed. No pertinent past medical history.   There are no active problems to display for this patient.    Past Surgical History  Procedure Laterality Date  . Cardiac valve replacement       No current outpatient prescriptions on file. None  Allergies Codeine   History reviewed. No pertinent family history.  Social History Social History  Substance Use Topics  . Smoking status: Never Smoker   . Smokeless tobacco: None  . Alcohol Use: No    Review of Systems  Constitutional:   No fever or chills. No weight changes. Positive generalized weakness Eyes:   No blurry vision or double vision.  ENT:   No sore throat. Cardiovascular:   No chest pain. Respiratory:   Positive shortness of breath and productive cough. Gastrointestinal:   Negative for abdominal pain, vomiting and diarrhea.  No BRBPR or melena. Genitourinary:   Negative for dysuria, urinary retention, bloody urine, or difficulty urinating. Musculoskeletal:   Negative for back pain. No joint swelling or pain. Skin:   Negative for rash. Neurological:   Negative for headaches, focal weakness or numbness. Psychiatric:  No anxiety or depression.   Endocrine:  No hot/cold intolerance, changes  in energy, or sleep difficulty.  10-point ROS otherwise negative.  ____________________________________________   PHYSICAL EXAM:  VITAL SIGNS: ED Triage Vitals  Enc Vitals Group     BP 02/17/15 1045 113/33 mmHg     Pulse Rate 02/17/15 1045 88     Resp 02/17/15 1045 24     Temp 02/17/15 1045 98.5 F (36.9 C)     Temp Source 02/17/15 1045 Oral     SpO2 02/17/15 1045 95 %     Weight 02/17/15 1045 136 lb 8 oz (61.916 kg)     Height 02/17/15 1045 5\' 1"  (1.549 m)     Head Cir --      Peak Flow --      Pain Score 02/17/15 1046 0     Pain Loc --      Pain Edu? --      Excl. in GC? --      Constitutional:   Alert and oriented. Well appearing and in no distress. Eyes:   No scleral icterus. No conjunctival pallor. PERRL. EOMI ENT   Head:   Normocephalic and atraumatic.   Nose:   No congestion/rhinnorhea. No septal hematoma   Mouth/Throat:   MMM, no pharyngeal erythema. No peritonsillar mass. No uvula shift.   Neck:   No stridor. No SubQ emphysema. No meningismus. Hematological/Lymphatic/Immunilogical:   No cervical lymphadenopathy. Cardiovascular:   RRR. Normal and symmetric distal pulses are present in all extremities. No murmurs, rubs, or gallops. Respiratory:   Wheezing and left middle lung field  with rhonchi in the left lower and mid lung. Slight rhonchi in the right base. Gastrointestinal:   Soft and nontender. No distention. There is no CVA tenderness.  No rebound, rigidity, or guarding. Genitourinary:   deferred Musculoskeletal:   Nontender with normal range of motion in all extremities. No joint effusions.  No lower extremity tenderness.  No edema. Neurologic:   Normal speech and language.  CN 2-10 normal. Motor grossly intact. No pronator drift.  Normal gait. No gross focal neurologic deficits are appreciated.  Skin:    Skin is warm, dry and intact. No rash noted.  No petechiae, purpura, or bullae. Psychiatric:   Mood and affect are normal. Speech and  behavior are normal. Patient exhibits appropriate insight and judgment.  ____________________________________________    LABS (pertinent positives/negatives) (all labs ordered are listed, but only abnormal results are displayed) Labs Reviewed  COMPREHENSIVE METABOLIC PANEL - Abnormal; Notable for the following:    Chloride 100 (*)    Glucose, Bld 110 (*)    Calcium 8.4 (*)    ALT 10 (*)    All other components within normal limits  CBC WITH DIFFERENTIAL/PLATELET - Abnormal; Notable for the following:    Platelets 137 (*)    Lymphs Abs 0.3 (*)    All other components within normal limits  URINALYSIS COMPLETEWITH MICROSCOPIC ([facility] ONLY) - Abnormal; Notable for the following:    Color, Urine YELLOW (*)    APPearance CLEAR (*)    Ketones, ur TRACE (*)    Protein, ur 30 (*)    Bacteria, UA RARE (*)    Squamous Epithelial / LPF 0-5 (*)    All other components within normal limits  URINE CULTURE  LIPASE, BLOOD  TROPONIN I  MAGNESIUM  PHOSPHORUS   ____________________________________________   EKG    ____________________________________________    RADIOLOGY  Chest x-ray being read by radiology as mild interstitial edema. However on my interpretation, it appears the patient has an ill-defined hazy opacity of the bilateral lower lungs consistent with atypical pneumonia.  ____________________________________________   PROCEDURES   ____________________________________________   INITIAL IMPRESSION / ASSESSMENT AND PLAN / ED COURSE  Pertinent labs & imaging results that were available during my care of the patient were reviewed by me and considered in my medical decision making (see chart for details).  Patient presents with acute hypoxic respiratory failure with a productive cough and shortness of breath. X-ray on my interpretation and clinical exam are consistent with community-acquired pneumonia. We'll admit the patient for supplemental oxygen and antibiotics acute  decline in her functional status in the setting of this acute illness. Case was discussed with hospitalist Dr. Juliene Pina.     ____________________________________________   FINAL CLINICAL IMPRESSION(S) / ED DIAGNOSES  Final diagnoses:  Bilateral pneumonia  Acute respiratory failure with hypoxia (HCC)      Sharman Cheek, MD 02/17/15 1350

## 2015-02-18 ENCOUNTER — Encounter: Payer: Self-pay | Admitting: Radiology

## 2015-02-18 ENCOUNTER — Inpatient Hospital Stay: Payer: Medicare Other

## 2015-02-18 ENCOUNTER — Inpatient Hospital Stay
Admit: 2015-02-18 | Discharge: 2015-02-18 | Disposition: A | Discharge status: Home / Self Care | Payer: Medicare Other | Attending: Internal Medicine | Admitting: Internal Medicine

## 2015-02-18 LAB — URINE CULTURE

## 2015-02-18 LAB — CBC
HCT: 36.8 % (ref 35.0–47.0)
Hemoglobin: 12.4 g/dL (ref 12.0–16.0)
MCH: 30.9 pg (ref 26.0–34.0)
MCHC: 33.6 g/dL (ref 32.0–36.0)
MCV: 91.9 fL (ref 80.0–100.0)
PLATELETS: 121 10*3/uL — AB (ref 150–440)
RBC: 4.01 MIL/uL (ref 3.80–5.20)
RDW: 13.8 % (ref 11.5–14.5)
WBC: 6.3 10*3/uL (ref 3.6–11.0)

## 2015-02-18 LAB — BASIC METABOLIC PANEL
Anion gap: 8 (ref 5–15)
BUN: 15 mg/dL (ref 6–20)
CHLORIDE: 104 mmol/L (ref 101–111)
CO2: 27 mmol/L (ref 22–32)
CREATININE: 0.64 mg/dL (ref 0.44–1.00)
Calcium: 8 mg/dL — ABNORMAL LOW (ref 8.9–10.3)
GFR calc Af Amer: >60 mL/min (ref >60)
GFR calc non Af Amer: >60 mL/min (ref >60)
GLUCOSE: 137 mg/dL — AB (ref 65–99)
Potassium: 3.3 mmol/L — ABNORMAL LOW (ref 3.5–5.1)
SODIUM: 139 mmol/L (ref 135–145)

## 2015-02-18 MED ORDER — ATORVASTATIN CALCIUM 20 MG PO TABS
40.0000 mg | ORAL_TABLET | Freq: Every day | ORAL | Status: DC
  Administered 2015-02-18 – 2015-02-22 (×4): 40 mg via ORAL
  Filled 2015-02-18 (×5): qty 2

## 2015-02-18 MED ORDER — ASPIRIN EC 81 MG PO TBEC
81.0000 mg | DELAYED_RELEASE_TABLET | Freq: Every day | ORAL | Status: DC
  Administered 2015-02-18 – 2015-02-23 (×6): 81 mg via ORAL
  Filled 2015-02-18 (×6): qty 1

## 2015-02-18 MED ORDER — HEPARIN (PORCINE) IN NACL 100-0.45 UNIT/ML-% IJ SOLN
750.0000 [IU]/h | INTRAMUSCULAR | Status: DC
  Administered 2015-02-18 – 2015-02-19 (×2): 750 [IU]/h via INTRAVENOUS
  Filled 2015-02-18 (×3): qty 250

## 2015-02-18 MED ORDER — POTASSIUM CHLORIDE CRYS ER 20 MEQ PO TBCR
40.0000 meq | EXTENDED_RELEASE_TABLET | Freq: Once | ORAL | Status: AC
  Administered 2015-02-18: 40 meq via ORAL
  Filled 2015-02-18 (×2): qty 2

## 2015-02-18 MED ORDER — HEPARIN BOLUS VIA INFUSION
3600.0000 [IU] | Freq: Once | INTRAVENOUS | Status: AC
  Administered 2015-02-18: 3600 [IU] via INTRAVENOUS
  Filled 2015-02-18: qty 3600

## 2015-02-18 MED ORDER — BUDESONIDE 0.25 MG/2ML IN SUSP
0.2500 mg | Freq: Two times a day (BID) | RESPIRATORY_TRACT | Status: DC
  Administered 2015-02-18 – 2015-02-23 (×9): 0.25 mg via RESPIRATORY_TRACT
  Filled 2015-02-18 (×9): qty 2

## 2015-02-18 MED ORDER — IOHEXOL 350 MG/ML SOLN
75.0000 mL | Freq: Once | INTRAVENOUS | Status: AC | PRN
  Administered 2015-02-18: 75 mL via INTRAVENOUS

## 2015-02-18 MED ORDER — METHYLPREDNISOLONE SODIUM SUCC 40 MG IJ SOLR
40.0000 mg | Freq: Three times a day (TID) | INTRAMUSCULAR | Status: DC
  Administered 2015-02-18 – 2015-02-23 (×15): 40 mg via INTRAVENOUS
  Filled 2015-02-18 (×15): qty 1

## 2015-02-18 NOTE — Progress Notes (Signed)
Patient ID: Savannah Friedman, female   DOB: 07/14/1923, 81 y.o.   MRN: [id number]  Spoke with neurology Dr. Loretha BrasilZeylikman to see the patient Spoke with Dr. Wyn Quakerew vascular surgery to see the patient  Aspirin started earlier today. Dr. Wyn Quakerew recommended heparin drip. Dr. Loretha BrasilZeylikman recommended higher blood pressures at this point and wanted to hold the Lasix.  Follow-up CT angiogram of the head and neck results.  Dictated by Dr. Alford Highlandichard Allen Basista

## 2015-02-18 NOTE — Progress Notes (Signed)
Patient ID: Savannah Friedman, female   DOB: June 07, 1923, 81 y.o.   MRN: [id number] [facility] PROGRESS NOTE  Savannah Friedman NWG:[id number] DOB: June 07, 1923 DOA: 02/17/2015 PCP: Lynnea FerrierBERT J KLEIN III, MD  HPI/Subjective: Called by nursing staff that the patient thinks that she had a stroke and having right-sided weakness. The patient states that she woke up with this right-sided weakness. As per family, she did have some right hand incoordination yesterday. As per family, she was lying on the ground for a few hours prior to coming into the hospital.  Objective: Filed Vitals:   02/17/15 2152 02/18/15 1032  BP: 90/44 113/49  Pulse: 101 91  Temp: 97.6 F (36.4 C)   Resp: 16     Filed Weights   02/17/15 1045  Weight: 61.916 kg (136 lb 8 oz)    ROS: Review of Systems  Constitutional: Positive for malaise/fatigue. Negative for fever and chills.  Eyes: Negative for blurred vision.  Respiratory: Positive for cough, shortness of breath and wheezing.   Cardiovascular: Negative for chest pain.  Gastrointestinal: Negative for nausea, vomiting, abdominal pain, diarrhea and constipation.  Genitourinary: Negative for dysuria.  Musculoskeletal: Negative for joint pain.  Neurological: Positive for focal weakness and weakness. Negative for dizziness and headaches.   Exam: Physical Exam  Constitutional: She is oriented to person, place, and time.  HENT:  Nose: No mucosal edema.  Mouth/Throat: No oropharyngeal exudate or posterior oropharyngeal edema.  Eyes: Conjunctivae, EOM and lids are normal. Pupils are equal, round, and reactive to light.  Neck: No JVD present. Carotid bruit is not present. No edema present. No thyroid mass and no thyromegaly present.  Cardiovascular: S1 normal and S2 normal.  Exam reveals no gallop.   No murmur heard. Pulses:      Dorsalis pedis pulses are 2+ on the right side, and 2+ on the left side.  Respiratory: No respiratory distress. She has decreased  breath sounds in the right middle field, the right lower field, the left middle field and the left lower field. She has wheezes in the right middle field, the right lower field, the left middle field and the left lower field. She has no rhonchi. She has no rales.  GI: Soft. Bowel sounds are normal. There is no tenderness.  Musculoskeletal:       Right ankle: She exhibits no swelling.       Left ankle: She exhibits no swelling.  Lymphadenopathy:    She has no cervical adenopathy.  Neurological: She is alert and oriented to person, place, and time. No cranial nerve deficit.  Right hand incoordination. Right hand finger nose slightly impaired. 4 out of 5 strength right leg. 4+ out of 5 strength right arm.  Skin: Skin is warm. No rash noted. Nails show no clubbing.  Psychiatric: She has a normal mood and affect.    Data Reviewed: Basic Metabolic Panel:  Recent Labs Lab 02/17/15 1048 02/18/15 0503  NA 135 139  K 3.8 3.3*  CL 100* 104  CO2 26 27  GLUCOSE 110* 137*  BUN 18 15  CREATININE 0.73 0.64  CALCIUM 8.4* 8.0*  MG 1.8  --   PHOS 3.0  --    Liver Function Tests:  Recent Labs Lab 02/17/15 1048  AST 22  ALT 10*  ALKPHOS 58  BILITOT 1.0  PROT 6.5  ALBUMIN 4.0    Recent Labs Lab 02/17/15 1048  LIPASE 31   CBC:  Recent Labs Lab 02/17/15 1048 02/18/15 0503  WBC 3.8 6.3  NEUTROABS 3.1  --   HGB 13.3 12.4  HCT 39.7 36.8  MCV 93.6 91.9  PLT 137* 121*     Studies: Dg Chest 2 View  02/17/2015  CLINICAL DATA:  Generalized weakness.  Hypoxia with cough. EXAM: CHEST  2 VIEW COMPARISON:  None. FINDINGS: There is a prosthetic heart valve, likely a mitral valve. Prominent interstitial lung markings could represent chronic changes versus mild interstitial edema. There are epicardial pacer wires in the chest. No large pleural effusions. Heart size is within normal limits. IMPRESSION: Prominent interstitial lung markings are concerning for mild interstitial edema.  Electronically Signed   By: Richarda Overlie M.D.   On: 02/17/2015 13:17    Scheduled Meds: . aspirin EC  81 mg Oral Daily  . budesonide (PULMICORT) nebulizer solution  0.25 mg Nebulization BID  . enoxaparin (LOVENOX) injection  40 mg Subcutaneous Q24H  . furosemide  20 mg Intravenous BID  . ipratropium-albuterol  3 mL Nebulization Q4H  . [START ON 02/19/2015] levofloxacin (LEVAQUIN) IV  250 mg Intravenous Q24H  . methylPREDNISolone (SOLU-MEDROL) injection  40 mg Intravenous Q8H    Assessment/Plan:  1. Right-sided weakness. Patient states that she woke up with this. As per family, she did have some last night. I will get a stat CT scan of the head and carotid ultrasound. Get neurology consultation PT and OT consultations. Give a stat aspirin. Patient does have pins in her knees and unsure if she can get an MRI. 2. Acute bronchitis with asthmatic component- Rocephin and Zithromax. Add low-dose IV Medrol and budesonide nebulizers 3. Acute diastolic congestive heart failure with moderate mitral valve regurgitation- low-dose Lasix given. No beta blocker with bronchospasm. 4. Thrombocytopenia- patient's platelet count has been variable over the years. Continue to monitor  Code Status:     Code Status Orders        Start     Ordered   02/17/15 1545  Full code   Continuous     02/17/15 1544     Family Communication: Son and significant other at the bedside Disposition Plan: To be determined  Consultants:  Neurology  Antibiotics: Rocephin and Zithromax  Time spent: 30 minutes  Alford Highland  [facility]

## 2015-02-18 NOTE — Progress Notes (Signed)
CTA imaging reviewed with Dr. Wyn Quakerew. Left ICA occluded. Please continue heparin drip for now. Full consult note to follow.

## 2015-02-18 NOTE — Progress Notes (Addendum)
ANTICOAGULATION CONSULT NOTE - Initial Consult  Pharmacy Consult for Heparin  Indication: Carotid artery stenosis  Allergies  Allergen Reactions  . Codeine Nausea And Vomiting    Patient Measurements: Height: 5\' 1"  (154.9 cm) Weight: 136 lb 8 oz (61.916 kg) IBW/kg (Calculated) : 47.8 Heparin Dosing Weight: 60.4 kg   Vital Signs: BP: 113/49 mmHg (11/27 1032) Pulse Rate: 91 (11/27 1032)  Labs:  Recent Labs  02/17/15 1048 02/18/15 0503  HGB 13.3 12.4  HCT 39.7 36.8  PLT 137* 121*  CREATININE 0.73 0.64  TROPONINI <0.03  --     Estimated Creatinine Clearance: 40.2 mL/min (by C-G formula based on Cr of 0.64).   Medical History: Past Medical History  Diagnosis Date  . Anemia     Medications:  No prescriptions prior to admission    Assessment: Pharmacy consulted to dose heparin in this 81 year old female admitted with carotid artery stenosis.   CrCl =  40.2 ml/min  Goal of Therapy:  Heparin level 0.3-0.7 units/ml Monitor platelets by anticoagulation protocol: Yes   Plan:  Give 3600 units bolus x 1 Start heparin infusion at 750 units/hr  Will draw 1st HL on 11/28 @ 1:00.  Will check CBC with AM labs.   Madgeline Rayo D 02/18/2015,3:27 PM

## 2015-02-18 NOTE — Progress Notes (Signed)
Patient ID: Jene Everyudrey T Douglas, female   DOB: April 28, 1923, 81 y.o.   MRN: [id number]  Called by radiologist with low flow with the left carotid on ultrasound. He recommended a CT a of the carotids for further evaluation  Case discussed with son on the phone for further testing.  Dictated by Dr. Alford Highlandichard Ramonda Galyon.

## 2015-02-18 NOTE — Progress Notes (Signed)
*  PRELIMINARY RESULTS* Echocardiogram 2D Echocardiogram has been performed.  Savannah Friedman 02/18/2015, 8:17 AM

## 2015-02-19 ENCOUNTER — Inpatient Hospital Stay: Payer: Medicare Other

## 2015-02-19 LAB — CBC
HCT: 40.8 % (ref 35.0–47.0)
Hemoglobin: 13.5 g/dL (ref 12.0–16.0)
MCH: 30.9 pg (ref 26.0–34.0)
MCHC: 33.1 g/dL (ref 32.0–36.0)
MCV: 93.2 fL (ref 80.0–100.0)
PLATELETS: 153 10*3/uL (ref 150–440)
RBC: 4.38 MIL/uL (ref 3.80–5.20)
RDW: 14.1 % (ref 11.5–14.5)
WBC: 4.2 10*3/uL (ref 3.6–11.0)

## 2015-02-19 LAB — LIPID PANEL
CHOL/HDL RATIO: 2.7 ratio
Cholesterol: 240 mg/dL — ABNORMAL HIGH (ref 0–200)
HDL: 89 mg/dL (ref >40)
LDL CALC: 134 mg/dL — AB (ref 0–99)
TRIGLYCERIDES: 87 mg/dL (ref <150)
VLDL: 17 mg/dL (ref 0–40)

## 2015-02-19 LAB — FOLATE: Folate: 12.1 ng/mL (ref >5.9)

## 2015-02-19 LAB — HEPARIN LEVEL (UNFRACTIONATED)
HEPARIN UNFRACTIONATED: 0.51 [IU]/mL (ref 0.30–0.70)
Heparin Unfractionated: 0.56 IU/mL (ref 0.30–0.70)

## 2015-02-19 LAB — PROTIME-INR
INR: 0.94
Prothrombin Time: 12.8 seconds (ref 11.4–15.0)

## 2015-02-19 LAB — SEDIMENTATION RATE: Sed Rate: 32 mm/hr — ABNORMAL HIGH (ref 0–30)

## 2015-02-19 LAB — TSH: TSH: 1.44 u[IU]/mL (ref 0.350–4.500)

## 2015-02-19 MED ORDER — WARFARIN SODIUM 5 MG PO TABS
5.0000 mg | ORAL_TABLET | Freq: Every day | ORAL | Status: DC
  Administered 2015-02-19: 5 mg via ORAL
  Filled 2015-02-19: qty 1

## 2015-02-19 MED ORDER — NAPROXEN 375 MG PO TABS
375.0000 mg | ORAL_TABLET | Freq: Two times a day (BID) | ORAL | Status: DC
  Administered 2015-02-20 – 2015-02-21 (×2): 375 mg via ORAL
  Filled 2015-02-19 (×7): qty 1

## 2015-02-19 MED ORDER — KETOROLAC TROMETHAMINE 15 MG/ML IJ SOLN
15.0000 mg | Freq: Once | INTRAMUSCULAR | Status: DC
  Filled 2015-02-19: qty 1

## 2015-02-19 MED ORDER — INFLUENZA VAC SPLIT QUAD 0.5 ML IM SUSY
0.5000 mL | PREFILLED_SYRINGE | INTRAMUSCULAR | Status: DC
Start: 2015-02-20 — End: 2015-02-23
  Filled 2015-02-19: qty 0.5

## 2015-02-19 MED ORDER — WARFARIN - PHARMACIST DOSING INPATIENT: Freq: Every day | Status: DC

## 2015-02-19 NOTE — Consult Note (Signed)
[facility] Vascular Consult Note  MRN : [id number]  Savannah Friedman is a 81 y.o. (1923/11/04) female who presents with chief complaint of  Chief Complaint  Patient presents with  . Weakness  .  History of Present Illness: Patient is an 81 year old female admitted to the hospital a couple of days ago with shortness of breath and found to have double pneumonia. While in the hospital, she began developing tremors in both upper extremities as well as weakness in the right arm and some slight discoordination of the right arm and right leg. Her right arm still has discoordination but her strength has seemed to improve some. She did not have any speech or swallowing issues. She denied any monocular blindness. She has a history of cardiac valve replacement about a decade ago. She still has significant mitral regurgitation. As part of her workup she had a carotid duplex and then a CT angiogram which I have independently reviewed. She has very mild right carotid artery atherosclerotic disease without any significant stenosis. Her left carotid artery is clearly completely occluded from the common carotid artery up to the intracranial internal carotid artery. There is not a lot of calcification this may be more of an embolic phenomenon. Whether this is new or old, it is difficult to tell.  Current Facility-Administered Medications  Medication Dose Route Frequency Provider Last Rate Last Dose  . acetaminophen (TYLENOL) tablet 650 mg  650 mg Oral Q6H PRN Adrian Saran, MD       Or  . acetaminophen (TYLENOL) suppository 650 mg  650 mg Rectal Q6H PRN Adrian Saran, MD      . alum & mag hydroxide-simeth (MAALOX/MYLANTA) 200-200-20 MG/5ML suspension 30 mL  30 mL Oral Q6H PRN Adrian Saran, MD      . aspirin EC tablet 81 mg  81 mg Oral Daily Alford Highland, MD   81 mg at 02/18/15 1200  . atorvastatin (LIPITOR) tablet 40 mg  40 mg Oral q1800 Alford Highland, MD   40 mg at 02/18/15 1829  .  bisacodyl (DULCOLAX) EC tablet 5 mg  5 mg Oral Daily PRN Adrian Saran, MD      . budesonide (PULMICORT) nebulizer solution 0.25 mg  0.25 mg Nebulization BID Alford Highland, MD   0.25 mg at 02/19/15 0741  . heparin ADULT infusion 100 units/mL (25000 units/250 mL)  750 Units/hr Intravenous Continuous Alford Highland, MD 7.5 mL/hr at 02/18/15 1637 750 Units/hr at 02/18/15 1637  . HYDROcodone-acetaminophen (NORCO/VICODIN) 5-325 MG per tablet 1-2 tablet  1-2 tablet Oral Q4H PRN Adrian Saran, MD      . Melene Muller ON 02/20/2015] Influenza vac split quadrivalent PF (FLUARIX) injection 0.5 mL  0.5 mL Intramuscular Tomorrow-1000 Alford Highland, MD      . ipratropium-albuterol (DUONEB) 0.5-2.5 (3) MG/3ML nebulizer solution 3 mL  3 mL Nebulization Q4H Sital Mody, MD   3 mL at 02/19/15 0741  . Levofloxacin (LEVAQUIN) IVPB 250 mg  250 mg Intravenous Q24H Sital Mody, MD      . methylPREDNISolone sodium succinate (SOLU-MEDROL) 40 mg/mL injection 40 mg  40 mg Intravenous Q8H Alford Highland, MD   40 mg at 02/19/15 0446  . ondansetron (ZOFRAN) tablet 4 mg  4 mg Oral Q6H PRN Adrian Saran, MD       Or  . ondansetron (ZOFRAN) injection 4 mg  4 mg Intravenous Q6H PRN Sital Mody, MD      . senna-docusate (Senokot-S) tablet 1 tablet  1 tablet Oral QHS PRN  Adrian Saran, MD        Past Medical History  Diagnosis Date  . Anemia     Past Surgical History  Procedure Laterality Date  . Cardiac valve replacement      Social History Social History  Substance Use Topics  . Smoking status: Former Smoker -- 30 years    Types: Cigarettes  . Smokeless tobacco: None  . Alcohol Use: 4.2 oz/week    7 Glasses of wine per week  No IV  Family History No bleeding disorder, clotting disorders, or autoimmune diseases  Allergies  Allergen Reactions  . Codeine Nausea And Vomiting     REVIEW OF SYSTEMS (Negative unless checked)  Constitutional: Weight loss  Fever  Chills Cardiac: Chest pain   Chest pressure    Palpitations   Shortness of breath when laying flat   Shortness of breath at rest   Shortness of breath with exertion. Vascular:  Pain in legs with walking   Pain in legs at rest   Pain in legs when laying flat   Claudication   Pain in feet when walking  Pain in feet at rest  Pain in feet when laying flat   History of DVT   Phlebitis   Swelling in legs   Varicose veins   Non-healing ulcers Pulmonary:   Uses home oxygen   Productive cough   Hemoptysis   Wheeze  COPD   Asthma Neurologic:  Dizziness  Blackouts   Seizures   History of stroke   History of TIA  Aphasia   Temporary blindness   Dysphagia   Weakness or numbness in arms   Weakness or numbness in legs Musculoskeletal:  Arthritis   Joint swelling   Joint pain   Low back pain Hematologic:  Easy bruising  Easy bleeding   Hypercoagulable state   Anemic  Hepatitis Gastrointestinal:  Blood in stool   Vomiting blood  Gastroesophageal reflux/heartburn   Difficulty swallowing. Genitourinary:  Chronic kidney disease   Difficult urination  Frequent urination  Burning with urination   Blood in urine Skin:  Rashes   Ulcers   Wounds Psychological:  History of anxiety    History of major depression.  Physical Examination  Filed Vitals:   02/18/15 1900 02/18/15 2055 02/19/15 0451 02/19/15 0743  BP:  117/71 131/53   Pulse:  103 96   Temp:  97.8 F (36.6 C) 97.5 F (36.4 C)   TempSrc:  Oral Oral   Resp:  17    Height:      Weight:      SpO2: 94% 96% 98% 98%   Body mass index is 25.8 kg/(m^2). Gen:  WD/WN, NAD. Appears younger than stated age. Head: [place]/AT Ear/Nose/Throat: Hearing grossly intact, nares w/o erythema or drainage, oropharynx w/o Erythema/Exudate Eyes: PERRLA, EOMI.  Neck: Supple, no nuchal rigidity.  No JVD.  Pulmonary:  Good air movement, some use of accessory muscles Cardiac: RRR, normal S1, S2 Vascular:  Vessel Right  Left  Radial Palpable Palpable                                   Gastrointestinal: soft, non-tender/non-distended. No guarding/reflex. No masses, surgical incisions, or scars. Musculoskeletal: RUE with mild discoordination, but strength appears returned largely. LE strength 5/5 throughout.Marland Kitchen  Extremities without ischemic changes.  No deformity or atrophy. No edema. Neurologic: CN 2-12 intact. Pain and light touch intact in extremities.  Symmetrical.  Speech is fluent. Motor exam as listed above. Psychiatric: Judgment intact, Mood & affect appropriate for pt's clinical situation. Dermatologic: No rashes or ulcers noted.  No cellulitis or open wounds. Lymph : No Cervical, Axillary, or Inguinal lymphadenopathy.     CBC Lab Results  Component Value Date   WBC 4.2 02/19/2015   HGB 13.5 02/19/2015   HCT 40.8 02/19/2015   MCV 93.2 02/19/2015   PLT 153 02/19/2015    BMET    Component Value Date/Time   NA 139 02/18/2015 0503   K 3.3* 02/18/2015 0503   CL 104 02/18/2015 0503   CO2 27 02/18/2015 0503   GLUCOSE 137* 02/18/2015 0503   BUN 15 02/18/2015 0503   CREATININE 0.64 02/18/2015 0503   CALCIUM 8.0* 02/18/2015 0503   GFRNONAA >60 02/18/2015 0503   GFRAA >60 02/18/2015 0503   Estimated Creatinine Clearance: 40.2 mL/min (by C-G formula based on Cr of 0.64).  COAG No results found for: INR, PROTIME  Radiology Ct Angio Head W/cm &/or Wo Cm  02/18/2015  CLINICAL DATA:  Right-sided weakness. Possible stroke. Carotid stenosis. Near complete versus complete occlusion of the left ICA on ultrasound. EXAM: CT ANGIOGRAPHY HEAD AND NECK TECHNIQUE: Multidetector CT imaging of the head and neck was performed using the standard protocol during bolus administration of intravenous contrast. Multiplanar CT image reconstructions and MIPs were obtained to evaluate the vascular anatomy. Carotid stenosis measurements (when applicable) are obtained utilizing NASCET criteria, using the distal  internal carotid diameter as the denominator. CONTRAST:  75mL OMNIPAQUE IOHEXOL 350 MG/ML SOLN COMPARISON:  Noncontrast head CT and carotid ultrasound earlier today FINDINGS: CT HEAD Brain: There is no evidence of acute large territory infarct, intracranial hemorrhage, mass, midline shift, or extra-axial fluid collection on this contrast-enhanced study. Mild cerebral atrophy is noted. Patchy hypodensities in the subcortical and deep cerebral white matter are nonspecific but compatible with moderate chronic small vessel ischemic disease with chronic lacunar infarcts. Calvarium and skull base: No skull fracture. Paranasal sinuses: Small amount of fluid in the maxillary and sphenoid sinuses bilaterally and in the left frontal sinus. Mild bilateral ethmoid air cell mucosal thickening. Orbits: Prior bilateral cataract extraction. CTA NECK Aortic arch: 3 vessel aortic arch with mild atherosclerotic plaque. Brachiocephalic and subclavian arteries are patent without significant stenosis. Right carotid system: Patent without evidence of stenosis, dissection, or aneurysm. Trace calcified plaque at the bifurcation. Mildly tortuous cervical ICA. Left carotid system: Common carotid artery is patent at its origin but demonstrates progressively diminishing opacification with only faint contrast present distally. There is complete occlusion at the carotid bifurcation. The ICA is completely occluded in the neck. There is some reconstitution of left ECA branches distally. Vertebral arteries: The vertebral arteries are patent with the right being dominant. Scattered atherosclerotic plaque is noted without significant stenosis. Skeleton: Solid intervertebral osseous fusion at C5-6. Slight anterolisthesis of C3 on C4 due to advanced facet arthrosis, left worse than right. Other neck: Mild biapical pleural-parenchymal scarring in the lungs. Ground-glass and patchy consolidative opacities in both lung apices with a small right pleural  effusion, incompletely imaged. CTA HEAD Anterior circulation: The right internal carotid artery is patent from skullbase to carotid terminus with ectasia of the anterior cavernous and proximal supraclinoid segments. There is a mild calcified plaque without significant stenosis. The proximal left intracranial ICA is occluded with reconstitution of the supraclinoid segment by the posterior communicating artery. ACAs and MCAs are patent without evidence of significant stenosis or major branch vessel occlusion. No intracranial  aneurysm is identified. Posterior circulation: Intracranial vertebral arteries are patent to the basilar with the right being dominant. PICA origins, right AICA origin, and bilateral SCA origins are patent. Basilar artery is patent without stenosis. There is a small left posterior communicating artery. PCAs are unremarkable. Venous sinuses: Patent. Anatomic variants: None. Delayed phase: No abnormal enhancement. IMPRESSION: 1. Occlusion of the distal left common carotid artery/carotid bifurcation. 2. Reconstitution of the left ICA intracranially at the supraclinoid level. 3. No major intracranial arterial occlusion or significant intracranial stenosis. 4. Widely patent vertebral arteries and right cervical carotid arteries. 5. Partially visualized opacities in both lung apices which may reflect pulmonary edema as described on yesterday's chest radiographs. Small right pleural effusion. These results will be called to the ordering clinician or representative by the Radiologist Assistant, and communication documented in the PACS or zVision Dashboard. Electronically Signed   By: Sebastian AcheAllen  Grady M.D.   On: 02/18/2015 16:38   Dg Chest 2 View  02/17/2015  CLINICAL DATA:  Generalized weakness.  Hypoxia with cough. EXAM: CHEST  2 VIEW COMPARISON:  None. FINDINGS: There is a prosthetic heart valve, likely a mitral valve. Prominent interstitial lung markings could represent chronic changes versus mild  interstitial edema. There are epicardial pacer wires in the chest. No large pleural effusions. Heart size is within normal limits. IMPRESSION: Prominent interstitial lung markings are concerning for mild interstitial edema. Electronically Signed   By: Richarda OverlieAdam  Henn M.D.   On: 02/17/2015 13:17   Ct Head Wo Contrast  02/18/2015  CLINICAL DATA:  Pt with new onset of right sided weakness. Pt states she was unable to use her fork with her right hand while eating breakfast today. Pt states she was fine last night when she went to sleep. Pt denies hx of stroke or seizure. EXAM: CT HEAD WITHOUT CONTRAST TECHNIQUE: Contiguous axial images were obtained from the base of the skull through the vertex without intravenous contrast. COMPARISON:  Head CT 01/20/2015 FINDINGS: No acute intracranial hemorrhage. No focal mass lesion. No CT evidence of acute infarction. No midline shift or mass effect. No hydrocephalus. Basilar cisterns are patent. Charles cortical atrophy. There is mild periventricular white matter hypodensities. Paranasal sinuses and  mastoid air cells are clear. IMPRESSION: 1. No acute intracranial findings. 2. Atrophy and white matter microvascular disease. Electronically Signed   By: Genevive BiStewart  Edmunds M.D.   On: 02/18/2015 14:31   Ct Angio Neck W/cm &/or Wo/cm  02/18/2015  CLINICAL DATA:  Right-sided weakness. Possible stroke. Carotid stenosis. Near complete versus complete occlusion of the left ICA on ultrasound. EXAM: CT ANGIOGRAPHY HEAD AND NECK TECHNIQUE: Multidetector CT imaging of the head and neck was performed using the standard protocol during bolus administration of intravenous contrast. Multiplanar CT image reconstructions and MIPs were obtained to evaluate the vascular anatomy. Carotid stenosis measurements (when applicable) are obtained utilizing NASCET criteria, using the distal internal carotid diameter as the denominator. CONTRAST:  75mL OMNIPAQUE IOHEXOL 350 MG/ML SOLN COMPARISON:  Noncontrast  head CT and carotid ultrasound earlier today FINDINGS: CT HEAD Brain: There is no evidence of acute large territory infarct, intracranial hemorrhage, mass, midline shift, or extra-axial fluid collection on this contrast-enhanced study. Mild cerebral atrophy is noted. Patchy hypodensities in the subcortical and deep cerebral white matter are nonspecific but compatible with moderate chronic small vessel ischemic disease with chronic lacunar infarcts. Calvarium and skull base: No skull fracture. Paranasal sinuses: Small amount of fluid in the maxillary and sphenoid sinuses bilaterally and in the left frontal  sinus. Mild bilateral ethmoid air cell mucosal thickening. Orbits: Prior bilateral cataract extraction. CTA NECK Aortic arch: 3 vessel aortic arch with mild atherosclerotic plaque. Brachiocephalic and subclavian arteries are patent without significant stenosis. Right carotid system: Patent without evidence of stenosis, dissection, or aneurysm. Trace calcified plaque at the bifurcation. Mildly tortuous cervical ICA. Left carotid system: Common carotid artery is patent at its origin but demonstrates progressively diminishing opacification with only faint contrast present distally. There is complete occlusion at the carotid bifurcation. The ICA is completely occluded in the neck. There is some reconstitution of left ECA branches distally. Vertebral arteries: The vertebral arteries are patent with the right being dominant. Scattered atherosclerotic plaque is noted without significant stenosis. Skeleton: Solid intervertebral osseous fusion at C5-6. Slight anterolisthesis of C3 on C4 due to advanced facet arthrosis, left worse than right. Other neck: Mild biapical pleural-parenchymal scarring in the lungs. Ground-glass and patchy consolidative opacities in both lung apices with a small right pleural effusion, incompletely imaged. CTA HEAD Anterior circulation: The right internal carotid artery is patent from skullbase to  carotid terminus with ectasia of the anterior cavernous and proximal supraclinoid segments. There is a mild calcified plaque without significant stenosis. The proximal left intracranial ICA is occluded with reconstitution of the supraclinoid segment by the posterior communicating artery. ACAs and MCAs are patent without evidence of significant stenosis or major branch vessel occlusion. No intracranial aneurysm is identified. Posterior circulation: Intracranial vertebral arteries are patent to the basilar with the right being dominant. PICA origins, right AICA origin, and bilateral SCA origins are patent. Basilar artery is patent without stenosis. There is a small left posterior communicating artery. PCAs are unremarkable. Venous sinuses: Patent. Anatomic variants: None. Delayed phase: No abnormal enhancement. IMPRESSION: 1. Occlusion of the distal left common carotid artery/carotid bifurcation. 2. Reconstitution of the left ICA intracranially at the supraclinoid level. 3. No major intracranial arterial occlusion or significant intracranial stenosis. 4. Widely patent vertebral arteries and right cervical carotid arteries. 5. Partially visualized opacities in both lung apices which may reflect pulmonary edema as described on yesterday's chest radiographs. Small right pleural effusion. These results will be called to the ordering clinician or representative by the Radiologist Assistant, and communication documented in the PACS or zVision Dashboard. Electronically Signed   By: Sebastian Ache M.D.   On: 02/18/2015 16:38   US Carotid Bilateral  02/18/2015  CLINICAL DATA:  Right-sided weakness this morning. EXAM: BILATERAL CAROTID DUPLEX ULTRASOUND TECHNIQUE: Wallace Cullens scale imaging, color Doppler and duplex ultrasound were performed of bilateral carotid and vertebral arteries in the neck. COMPARISON:  Head CT today.  Cervical spine CT 12/27/2014 FINDINGS: Criteria: Quantification of carotid stenosis is based on velocity  parameters that correlate the residual internal carotid diameter with NASCET-based stenosis levels, using the diameter of the distal internal carotid lumen as the denominator for stenosis measurement. The following velocity measurements were obtained: RIGHT: ICA:  81 cm/sec CCA:  69 cm/sec SYSTOLIC ICA/CCA RATIO:  1.2 DIASTOLIC ICA/CCA RATIO:  1.5 ECA:  93 cm/sec LEFT ICA:  24 cm/sec CCA:  28 cm/sec SYSTOLIC ICA/CCA RATIO:  0.9 DIASTOLIC ICA/CCA RATIO:  1.4 ECA:  48 cm/sec RIGHT CAROTID ARTERY: Mild calcified plaque at the carotid bulb and proximal internal carotid artery. Normal Doppler waveforms. RIGHT VERTEBRAL ARTERY:  Normal antegrade flow and normal waveform. LEFT CAROTID ARTERY: Low amplitude high resistance waveform within the common carotid artery. Mild calcified plaque at the carotid bulb and proximal internal carotid artery. Echogenic material on grayscale images throughout  the visualized internal carotid artery with very slow flow detected and "thud" flow waveform indicating near complete occlusion versus complete occlusion. LEFT VERTEBRAL ARTERY:  Normal antegrade flow with normal waveform. IMPRESSION: Mild calcified atherosclerotic plaque at the carotid bulbs and proximal internal carotid arteries bilaterally. Findings compatible with complete versus near complete occlusion of the left internal carotid artery. CTA may be helpful to make the distinction between near near complete occlusion versus complete occlusion. No hemodynamically significant stenosis of the right internal carotid artery. Normal flow within the vertebral arteries. These results were called by telephone at the time of interpretation on 02/18/2015 at 2:50 pm to Dr. Alford Highland , who verbally acknowledged these results. Electronically Signed   By: Elberta Fortis M.D.   On: 02/18/2015 14:52   Ct Maxillofacial Wo Cm  01/20/2015  CLINICAL DATA:  Fall with injury to LEFT side of face on January 06, 2015. EXAM: CT MAXILLOFACIAL  WITHOUT CONTRAST TECHNIQUE: Multidetector CT imaging of the maxillofacial structures was performed. Multiplanar CT image reconstructions were also generated. A small metallic BB was placed on the right temple in order to reliably differentiate right from left. COMPARISON:  Head CT 12/27/2014 FINDINGS: The orbital walls are intact. The intraconal contents are normal. Globes are normal. No nasal bone fracture. No fluid frontal sinus. The zygomatic arches are intact. No fracture of the walls of the maxillary sinus. The pterygoid plates are intact. The mandibular condyles are located. No mandibular fracture no fluid in the paranasal sinuses. IMPRESSION: No facial bone fracture. Electronically Signed   By: Genevive Bi M.D.   On: 01/20/2015 11:08      Assessment/Plan 1. Stroke/TIA symptoms. Left carotid artery completely occluded and possibly chronically so. Likely a combination of inflow problems and overall medical illness and hypoxia creating neurologic symptoms. No surgical or interventional therapy will be of benefit. Would recommend anticoagulation as it is possible this is an acute occlusion. We'll then need dual antiplatelet after initial anticoagulation. Discussed with family and patient that we would then plan to monitor her carotid disease in the future largely to evaluate the other vessels and make sure they remain patent. 2. Pneumonia. Improving with medical therapy. May be a underlying contributing factor to her neurologic issues. 3. Valvular heart disease. Status post repair. Could be a source of embolic disease.   Olita Takeshita, MD  02/19/2015 9:47 AM

## 2015-02-19 NOTE — Progress Notes (Signed)
ANTIBIOTIC CONSULT NOTE -FOLLOW UP  Pharmacy Consult for Levaquin Indication: pneumonia  Allergies  Allergen Reactions  . Codeine Nausea And Vomiting    Patient Measurements: Height: 5\' 1"  (154.9 cm) Weight: 136 lb 8 oz (61.916 kg) IBW/kg (Calculated) : 47.8 Adjusted Body Weight:   Vital Signs: Temp: 97.5 F (36.4 C) (11/28 0451) Temp Source: Oral (11/28 0451) BP: 131/53 mmHg (11/28 0451) Pulse Rate: 96 (11/28 0451) Intake/Output from previous day: 11/27 0701 - 11/28 0700 In: 557.5 [P.O.:240; I.V.:217.5; IV Piggyback:100] Out: 1325 [Urine:1325] Intake/Output from this shift:    Labs:  Recent Labs  02/17/15 1048 02/18/15 0503 02/19/15 0135  WBC 3.8 6.3 4.2  HGB 13.3 12.4 13.5  PLT 137* 121* 153  CREATININE 0.73 0.64  --    Estimated Creatinine Clearance: 40.2 mL/min (by C-G formula based on Cr of 0.64). No results for input(s): VANCOTROUGH, VANCOPEAK, VANCORANDOM, GENTTROUGH, GENTPEAK, GENTRANDOM, TOBRATROUGH, TOBRAPEAK, TOBRARND, AMIKACINPEAK, AMIKACINTROU, AMIKACIN in the last 72 hours.   Microbiology: Recent Results (from the past 720 hour(s))  Urine culture     Status: None   Collection Time: 02/17/15 10:48 AM  Result Value Ref Range Status   Specimen Description URINE, RANDOM  Final   Special Requests NONE  Final   Culture MULTIPLE SPECIES PRESENT, SUGGEST RECOLLECTION  Final   Report Status 02/18/2015 FINAL  Final    Medical History: Past Medical History  Diagnosis Date  . Anemia     Medications:  No prescriptions prior to admission   Assessment: CrCl = 40.2 ml/min Azithromycin 500 mg IV X 1 given on 11/26 @ 16:00.  Goal of Therapy:  resolution of infection  Plan:  Expected duration 7 days with resolution of temperature and/or normalization of WBC  Will continue levaquin 250 mg IV Q24H  [place] Pacella D 02/19/2015,10:06 AM

## 2015-02-19 NOTE — Evaluation (Addendum)
Physical Therapy Evaluation Patient Details Name: Savannah Friedman MRN: [id number] DOB: 11/27/1923 Today's Date: 02/19/2015   History of Present Illness  Pt arrived to [facility] with complaints of SOB. She was initially admitted with acute respiratory failure related to acute bronchitis with asthmatic component as well as acute diastolic CHF. Pt subsequently developed RUE weakness although the timing of onset is somewaht unclear. Pt reports that immediately prior to arrival she "slid off" the end of her bed and was on the floor for multiple hours until she could call for emergency services. She sufferred one other fall recently which occurred in October. She was taken via EMS to the hospital but was not found to have any acute bony injuries. Pt denies other falls in the last 12 months. Pt reports that prior to current admission she was living alone and independent with ADLs/IADLs.   Clinical Impression  Pt demonstrates difficulty with bed mobility, transfers, and ambulation requiring min to modA+1 (+2 present for safety) to complete all activities. She demonstrates significant RUE weakness and some mild RLE weakness with manual muscle testing and functional mobility. Pt with poor balance in standing with inability to remain upright posture without assist to prevent full loss of balance. SaO2 remains >95% throughout entire evaluation on room air. Pt is still being assessed for neurological insult due to symptoms of R sided weakness. CT angio head/neck showed occuled L ICA. Pt will benefit from skilled PT services to address deficits in strength, balance, and mobility in order to return to full function at home.     Follow Up Recommendations CIR;SNF    Equipment Recommendations  Rolling walker with 5" wheels (TBD at SNF/CIR)    Recommendations for Other Services Rehab consult     Precautions / Restrictions Precautions Precautions: Fall Restrictions Weight Bearing Restrictions: No      Mobility  Bed Mobility Overal bed mobility: Needs Assistance Bed Mobility: Supine to Sit     Supine to sit: Min assist     General bed mobility comments: Pt requires assist for pushing up from right sidelying to sitting as well as minA+1 to scoot up toward EOB. Mild leaning ot the R but no true LOB in sitting.   Transfers Overall transfer level: Needs assistance Equipment used: Rolling walker (2 wheeled) Transfers: Sit to/from Stand Sit to Stand: Mod assist         General transfer comment: Pt requiring min/modA+1 to come from sitting to standing due to LOB posterior and to the R. Pt unable to self-correct for balance deficits in standing and requires continual support to maintain balance.   Ambulation/Gait Ambulation/Gait assistance: Mod assist;+2 safety/equipment Ambulation Distance (Feet): 5 Feet Assistive device: Rolling walker (2 wheeled) Gait Pattern/deviations: Step-to pattern;Decreased step length - right;Decreased step length - left Gait velocity: Decreased Gait velocity interpretation: <1.8 ft/sec, indicative of risk for recurrent falls General Gait Details: Pt with poor balance in standing with posterior leaning requiring therapist support to prevent full LOB. She demonstrates some mild R knee hyperextension in standing possibley representing some functional R quad weakness but difficult to assess. Pt with decreased step length and requires cues for turning and safe hand placement on chair before sitting.  Stairs            Wheelchair Mobility    Modified Rankin (Stroke Patients Only)       Balance Overall balance assessment: Needs assistance Sitting-balance support: Feet supported       Standing balance support: Bilateral upper extremity supported Standing  balance-Leahy Scale: Poor                               Pertinent Vitals/Pain Pain Assessment: 0-10 Pain Score: 5  Pain Location: R hip, chronic prior to admission. Pt reports she is currently  being treated for R hip bursitis with injections Pain Intervention(s): Monitored during session    Home Living Family/patient expects to be discharged to:: Unsure Living Arrangements: Alone Available Help at Discharge: Family Type of Home: House Home Access: Stairs to enter Entrance Stairs-Rails: Doctor, general practiceight;Left Entrance Stairs-Number of Steps: 2 Home Layout: One level Home Equipment: Environmental consultantWalker - 2 wheels;Cane - single point (no BSC, pt reports walker is "not in good shape")      Prior Function Level of Independence: Independent         Comments: full community ambulator, drives, independent with ADLs/IADLs     Hand Dominance   Dominant Hand: Right    Extremity/Trunk Assessment   Upper Extremity Assessment: RUE deficits/detail RUE Deficits / Details: LUE grossly 4 to 4+/5. R shoulder flexion 4-/5, R elbow flexion and extension 4-/5. Notably decreased R grip strength         Lower Extremity Assessment: RLE deficits/detail RLE Deficits / Details: Bilateral strength grossly 4 to 4+/5 with the exception of 4-/5 R knee flexion. Full strength with R ankle dorsiflexion/plantarflexion with gross testing. No RLE foot drop noted. Pt does appear to have some R knee hyperextension in standing possibly related to functional weakness in R quad       Communication   Communication: No difficulties  Cognition Arousal/Alertness: Awake/alert Behavior During Therapy: WFL for tasks assessed/performed Overall Cognitive Status: Within Functional Limits for tasks assessed                      General Comments      Exercises Other Exercises Other Exercises: Pt educated about seated and supine LE exercises      Assessment/Plan    PT Assessment Patient needs continued PT services  PT Diagnosis Difficulty walking;Abnormality of gait;Generalized weakness   PT Problem List Decreased strength;Decreased activity tolerance;Decreased balance;Decreased mobility  PT Treatment  Interventions DME instruction;Gait training;Stair training;Functional mobility training;Therapeutic activities;Therapeutic exercise;Balance training;Neuromuscular re-education;Patient/family education   PT Goals (Current goals can be found in the Care Plan section) Acute Rehab PT Goals Patient Stated Goal: "I want to get back to how I was before all this happened" PT Goal Formulation: With patient/family Time For Goal Achievement: 03/05/15 Potential to Achieve Goals: Good    Frequency 7X/week   Barriers to discharge Decreased caregiver support Lives alone    Co-evaluation               End of Session Equipment Utilized During Treatment: Gait belt Activity Tolerance: Patient tolerated treatment well Patient left: in chair;with call bell/phone within reach;with chair alarm set;with family/visitor present;with SCD's reapplied Nurse Communication: Mobility status         Time: 0820-0850 PT Time Calculation (min) (ACUTE ONLY): 30 min   Charges:   PT Evaluation $Initial PT Evaluation Tier I: 1 Procedure     PT G Codes:       Sharalyn InkJason D Vallorie Niccoli PT, DPT   Treavor Blomquist 02/19/2015, 9:41 AM

## 2015-02-19 NOTE — Evaluation (Signed)
Occupational Therapy Evaluation Patient Details Name: Savannah Friedman MRN: [id number] DOB: 21-Nov-1923 Today's Date: 02/19/2015    History of Present Illness This patient is an 81 year old female who came to [facility]  with complaints of shortness of breath. She was initially admitted with acute respiratory failure related to acute bronchitis with asthmatic component as well as acute diastolic CHF. Pt subsequently developed RUE weakness.  Pt reports she "slid" off her bed and did not have her button on her and took a while before she could call for help.  She was taken via EMS to [facility]  but was not found to have any fractures.    Clinical Impression   This patient is an 81 year old female with the above history. She lives alone in a one level house and was very active, [place] and playing golf and tennis.  She now needs assist and would benefit from Occupational Therapy for ADL/functioal mobility training.    Follow Up Recommendations  CIR;SNF    Equipment Recommendations       Recommendations for Other Services       Precautions / Restrictions Precautions Precautions: Fall Restrictions Weight Bearing Restrictions: No      Mobility Bed Mobility  Transfers            Balance                                    ADL                                         General ADL Comments: Patient had been independent and driving, [place] [facility] [place] [facility] [place]. She now will need minimal assist for upper body dressing and moderate assist for lower body dressing and toilet transfers. activities of daily living training provided-Taught patient energy saving techniques and taught hip kit to prevent shortness of breath during lower body dressing. Also instructed that she may not need the hip kit after rehab and not to purchase at this time.     Vision     Perception     Praxis       Pertinent Vitals/Pain Pain Assessment: 0-10 Pain Score: 5  Pain Location: R hip, chronic prior to admission. Pt reports she is currently being treated for R hip bursitis with injections Pain Intervention(s): Monitored during session     Hand Dominance Right   Extremity/Trunk Assessment Upper Extremity Assessment Upper Extremity Assessment: RUE Deficits / Details: LUE grossly 4 to 4+/5. R shoulder flexion 4-/5, R elbow flexion and extension 4-/5. Notably decreased R grip strength          Communication Communication Communication: No difficulties   Cognition Arousal/Alertness: Awake/alert Behavior During Therapy: WFL for tasks assessed/performed Overall Cognitive Status: Within Functional Limits for tasks assessed                     General Comments       Exercises Exercises: Other exercises Other Exercises Other Exercises: Pt educated about seated and supine LE exercises   Shoulder Instructions      Home Living Family/patient expects to be discharged to::  (Some type of Rehab) Living Arrangements: Alone Available Help at Discharge: Family Type  of Home: House Home Access: Stairs to enter [employer] of Steps: 2 Entrance Stairs-Rails: Right;Left Home Layout: One level     Bathroom Shower/Tub: Walk-in shower         Home Equipment: Environmental consultant - 2 wheels;Cane - single point          Prior Functioning/Environment Level of Independence: Independent        Comments: full community ambulator, drives, independent with ADLs/IADLs plays golf and tennis.    OT Diagnosis: Generalized weakness   OT Problem List: Decreased strength;Decreased activity tolerance;Decreased knowledge of use of DME or AE   OT Treatment/Interventions: Self-care/ADL training;Therapeutic exercise    OT Goals(Current goals can be found in the care plan section) Acute Rehab OT Goals Patient Stated Goal: I want to get back doing everything. OT Goal Formulation: With  patient Time For Goal Achievement: 03/05/15 Potential to Achieve Goals: Good  OT Frequency:  (Will need OT daily.)   Barriers to D/C:            Co-evaluation              End of Session Equipment Utilized During Treatment:  (hip kit)  Activity Tolerance:   Patient left: in chair;with call bell/phone within reach;with chair alarm set   Time: 5940-9050 OT Time Calculation (min): 27 min Charges:  OT General Charges $OT Visit: 1 Procedure OT Evaluation $Initial OT Evaluation Tier I: 1 Procedure OT Treatments $Self Care/Home Management : 8-22 mins G-Codes:    Myrene Galas, MS/OTR/L  02/19/2015, 11:19 AM

## 2015-02-19 NOTE — Progress Notes (Signed)
ANTICOAGULATION CONSULT NOTE - Initial Consult  Pharmacy Consult for Heparin  Indication: Carotid artery stenosis  Allergies  Allergen Reactions  . Codeine Nausea And Vomiting    Patient Measurements: Height: 5\' 1"  (154.9 cm) Weight: 136 lb 8 oz (61.916 kg) IBW/kg (Calculated) : 47.8 Heparin Dosing Weight: 60.4 kg   Vital Signs: Temp: 97.8 F (36.6 C) (11/27 2055) Temp Source: Oral (11/27 2055) BP: 117/71 mmHg (11/27 2055) Pulse Rate: 103 (11/27 2055)  Labs:  Recent Labs  02/17/15 1048 02/18/15 0503 02/19/15 0135  HGB 13.3 12.4 13.5  HCT 39.7 36.8 40.8  PLT 137* 121* 153  HEPARINUNFRC  --   --  0.56  CREATININE 0.73 0.64  --   TROPONINI <0.03  --   --     Estimated Creatinine Clearance: 40.2 mL/min (by C-G formula based on Cr of 0.64).   Medical History: Past Medical History  Diagnosis Date  . Anemia     Medications:  No prescriptions prior to admission    Assessment: Pharmacy consulted to dose heparin in this 81 year old female admitted with carotid artery stenosis.   CrCl =  40.2 ml/min  Goal of Therapy:  Heparin level 0.3-0.7 units/ml Monitor platelets by anticoagulation protocol: Yes   Plan:  1128 0135 heparin level therapeutic. Continue current rate and will draw confirmatory level in 8 hours.  Carola FrostNathan A Alston Berrie, Pharm.D., BCPS Clinical Pharmacist 02/19/2015,2:55 AM

## 2015-02-19 NOTE — Care Management (Signed)
PT is recommending inpatient rehab for patient, [facility] will review [facility] case and speak with Dr Sherryll BurgerShah concerning. More to follow.

## 2015-02-19 NOTE — Progress Notes (Signed)
ANTICOAGULATION CONSULT NOTE - Initial Consult  Pharmacy Consult for warfarin Indication: CVA with mitral valve replacement   Allergies  Allergen Reactions  . Codeine Nausea And Vomiting    Patient Measurements: Height: 5\' 1"  (154.9 cm) Weight: 136 lb 8 oz (61.916 kg) IBW/kg (Calculated) : 47.8 Heparin Dosing Weight:   Vital Signs: Temp: 97.7 F (36.5 C) (11/28 1314) Temp Source: Oral (11/28 1314) BP: 122/58 mmHg (11/28 1314) Pulse Rate: 96 (11/28 1314)  Labs:  Recent Labs  02/17/15 1048 02/18/15 0503 02/19/15 0135 02/19/15 0957  HGB 13.3 12.4 13.5  --   HCT 39.7 36.8 40.8  --   PLT 137* 121* 153  --   HEPARINUNFRC  --   --  0.56 0.51  CREATININE 0.73 0.64  --   --   TROPONINI <0.03  --   --   --     Estimated Creatinine Clearance: 40.2 mL/min (by C-G formula based on Cr of 0.64).   Medical History: Past Medical History  Diagnosis Date  . Anemia     Medications:  No prescriptions prior to admission   Scheduled:  . aspirin EC  81 mg Oral Daily  . atorvastatin  40 mg Oral q1800  . budesonide (PULMICORT) nebulizer solution  0.25 mg Nebulization BID  . [START ON 02/20/2015] Influenza vac split quadrivalent PF  0.5 mL Intramuscular Tomorrow-1000  . ipratropium-albuterol  3 mL Nebulization Q4H  . levofloxacin (LEVAQUIN) IV  250 mg Intravenous Q24H  . methylPREDNISolone (SOLU-MEDROL) injection  40 mg Intravenous Q8H  . warfarin  5 mg Oral q1800    Assessment: Patient being treated with heparin gtt and now being initiated on warfarin as well for carotid artery stenosis and mitral valve replacement Goal of Therapy:  INR 2.5-3.5  Monitor platelets by anticoagulation protocol: Yes   Plan:  MD would like to start warfarin 5 mg PO q1800 due to patient's size. Will continue with this order; however will evaluate for appropriateness of dose upon INR results.   Clee Pandit D 02/19/2015,3:26 PM

## 2015-02-19 NOTE — Consult Note (Signed)
Reason for Consult: R sided weakness Referring Physician:  Dr. Dionisio David is an 81 y.o. female.  HPI: 81 yo RHD F presents to [facility] due to shortness of breath.  In the hospital, she was noted to have some R sided weakness so Neurology was called.  Pt complains of chronic neck and back pain but states that the weakness is new.  Per family, this happened after she fell but pt is not able to elaborate on this.  She is having a lot of hip pain after this fall.  She states that her weakness has improved today.  Past Medical History  Diagnosis Date  . Anemia     Past Surgical History  Procedure Laterality Date  . Cardiac valve replacement      History reviewed. No pertinent family history.  Social History:  reports that she has quit smoking. Her smoking use included Cigarettes. She quit after 30 years of use. She does not have any smokeless tobacco history on file. She reports that she drinks about 4.2 oz of alcohol per week. Her drug history is not on file.  Allergies:  Allergies  Allergen Reactions  . Codeine Nausea And Vomiting    Medications: personally reviewed by me as per chart  Results for orders placed or performed during the hospital encounter of 02/17/15 (from the past 48 hour(s))  Basic metabolic panel     Status: Abnormal   Collection Time: 02/18/15  5:03 AM  Result Value Ref Range   Sodium 139 135 - 145 mmol/L   Potassium 3.3 (L) 3.5 - 5.1 mmol/L   Chloride 104 101 - 111 mmol/L   CO2 27 22 - 32 mmol/L   Glucose, Bld 137 (H) 65 - 99 mg/dL   BUN 15 6 - 20 mg/dL   Creatinine, Ser 0.64 0.44 - 1.00 mg/dL   Calcium 8.0 (L) 8.9 - 10.3 mg/dL   GFR calc non Af Amer >60 >60 mL/min   GFR calc Af Amer >60 >60 mL/min    Comment: (NOTE) The eGFR has been calculated using the CKD EPI equation. This calculation has not been validated in all clinical situations. eGFR's persistently <60 mL/min signify possible Chronic Kidney Disease.    Anion gap 8 5 - 15  CBC      Status: Abnormal   Collection Time: 02/18/15  5:03 AM  Result Value Ref Range   WBC 6.3 3.6 - 11.0 K/uL   RBC 4.01 3.80 - 5.20 MIL/uL   Hemoglobin 12.4 12.0 - 16.0 g/dL   HCT 36.8 35.0 - 47.0 %   MCV 91.9 80.0 - 100.0 fL   MCH 30.9 26.0 - 34.0 pg   MCHC 33.6 32.0 - 36.0 g/dL   RDW 13.8 11.5 - 14.5 %   Platelets 121 (L) 150 - 440 K/uL  Heparin level (unfractionated)     Status: None   Collection Time: 02/19/15  1:35 AM  Result Value Ref Range   Heparin Unfractionated 0.56 0.30 - 0.70 IU/mL    Comment:        IF HEPARIN RESULTS ARE BELOW EXPECTED VALUES, AND PATIENT DOSAGE HAS BEEN CONFIRMED, SUGGEST FOLLOW UP TESTING OF ANTITHROMBIN III LEVELS.   CBC     Status: None   Collection Time: 02/19/15  1:35 AM  Result Value Ref Range   WBC 4.2 3.6 - 11.0 K/uL   RBC 4.38 3.80 - 5.20 MIL/uL   Hemoglobin 13.5 12.0 - 16.0 g/dL   HCT 40.8 35.0 -  47.0 %   MCV 93.2 80.0 - 100.0 fL   MCH 30.9 26.0 - 34.0 pg   MCHC 33.1 32.0 - 36.0 g/dL   RDW 14.1 11.5 - 14.5 %   Platelets 153 150 - 440 K/uL  Heparin level (unfractionated)     Status: None   Collection Time: 02/19/15  9:57 AM  Result Value Ref Range   Heparin Unfractionated 0.51 0.30 - 0.70 IU/mL    Comment:        IF HEPARIN RESULTS ARE BELOW EXPECTED VALUES, AND PATIENT DOSAGE HAS BEEN CONFIRMED, SUGGEST FOLLOW UP TESTING OF ANTITHROMBIN III LEVELS.   Protime-INR     Status: None   Collection Time: 02/19/15  3:35 PM  Result Value Ref Range   Prothrombin Time 12.8 11.4 - 15.0 seconds   INR 0.94     Ct Angio Head W/cm &/or Wo Cm  02/18/2015  CLINICAL DATA:  Right-sided weakness. Possible stroke. Carotid stenosis. Near complete versus complete occlusion of the left ICA on ultrasound. EXAM: CT ANGIOGRAPHY HEAD AND NECK TECHNIQUE: Multidetector CT imaging of the head and neck was performed using the standard protocol during bolus administration of intravenous contrast. Multiplanar CT image reconstructions and MIPs were obtained to  evaluate the vascular anatomy. Carotid stenosis measurements (when applicable) are obtained utilizing NASCET criteria, using the distal internal carotid diameter as the denominator. CONTRAST:  71m OMNIPAQUE IOHEXOL 350 MG/ML SOLN COMPARISON:  Noncontrast head CT and carotid ultrasound earlier today FINDINGS: CT HEAD Brain: There is no evidence of acute large territory infarct, intracranial hemorrhage, mass, midline shift, or extra-axial fluid collection on this contrast-enhanced study. Mild cerebral atrophy is noted. Patchy hypodensities in the subcortical and deep cerebral white matter are nonspecific but compatible with moderate chronic small vessel ischemic disease with chronic lacunar infarcts. Calvarium and skull base: No skull fracture. Paranasal sinuses: Small amount of fluid in the maxillary and sphenoid sinuses bilaterally and in the left frontal sinus. Mild bilateral ethmoid air cell mucosal thickening. Orbits: Prior bilateral cataract extraction. CTA NECK Aortic arch: 3 vessel aortic arch with mild atherosclerotic plaque. Brachiocephalic and subclavian arteries are patent without significant stenosis. Right carotid system: Patent without evidence of stenosis, dissection, or aneurysm. Trace calcified plaque at the bifurcation. Mildly tortuous cervical ICA. Left carotid system: Common carotid artery is patent at its origin but demonstrates progressively diminishing opacification with only faint contrast present distally. There is complete occlusion at the carotid bifurcation. The ICA is completely occluded in the neck. There is some reconstitution of left ECA branches distally. Vertebral arteries: The vertebral arteries are patent with the right being dominant. Scattered atherosclerotic plaque is noted without significant stenosis. Skeleton: Solid intervertebral osseous fusion at C5-6. Slight anterolisthesis of C3 on C4 due to advanced facet arthrosis, left worse than right. Other neck: Mild biapical  pleural-parenchymal scarring in the lungs. Ground-glass and patchy consolidative opacities in both lung apices with a small right pleural effusion, incompletely imaged. CTA HEAD Anterior circulation: The right internal carotid artery is patent from skullbase to carotid terminus with ectasia of the anterior cavernous and proximal supraclinoid segments. There is a mild calcified plaque without significant stenosis. The proximal left intracranial ICA is occluded with reconstitution of the supraclinoid segment by the posterior communicating artery. ACAs and MCAs are patent without evidence of significant stenosis or major branch vessel occlusion. No intracranial aneurysm is identified. Posterior circulation: Intracranial vertebral arteries are patent to the basilar with the right being dominant. PICA origins, right AICA origin, and bilateral  SCA origins are patent. Basilar artery is patent without stenosis. There is a small left posterior communicating artery. PCAs are unremarkable. Venous sinuses: Patent. Anatomic variants: None. Delayed phase: No abnormal enhancement. IMPRESSION: 1. Occlusion of the distal left common carotid artery/carotid bifurcation. 2. Reconstitution of the left ICA intracranially at the supraclinoid level. 3. No major intracranial arterial occlusion or significant intracranial stenosis. 4. Widely patent vertebral arteries and right cervical carotid arteries. 5. Partially visualized opacities in both lung apices which may reflect pulmonary edema as described on yesterday's chest radiographs. Small right pleural effusion. These results will be called to the ordering clinician or representative by the Radiologist Assistant, and communication documented in the PACS or zVision Dashboard. Electronically Signed   By: Logan Bores M.D.   On: 02/18/2015 16:38   Ct Head Wo Contrast  02/18/2015  CLINICAL DATA:  Pt with new onset of right sided weakness. Pt states she was unable to use her fork with her  right hand while eating breakfast today. Pt states she was fine last night when she went to sleep. Pt denies hx of stroke or seizure. EXAM: CT HEAD WITHOUT CONTRAST TECHNIQUE: Contiguous axial images were obtained from the base of the skull through the vertex without intravenous contrast. COMPARISON:  Head CT 01/20/2015 FINDINGS: No acute intracranial hemorrhage. No focal mass lesion. No CT evidence of acute infarction. No midline shift or mass effect. No hydrocephalus. Basilar cisterns are patent. Charles cortical atrophy. There is mild periventricular white matter hypodensities. Paranasal sinuses and  mastoid air cells are clear. IMPRESSION: 1. No acute intracranial findings. 2. Atrophy and white matter microvascular disease. Electronically Signed   By: Suzy Bouchard M.D.   On: 02/18/2015 14:31   Ct Angio Neck W/cm &/or Wo/cm  02/18/2015  CLINICAL DATA:  Right-sided weakness. Possible stroke. Carotid stenosis. Near complete versus complete occlusion of the left ICA on ultrasound. EXAM: CT ANGIOGRAPHY HEAD AND NECK TECHNIQUE: Multidetector CT imaging of the head and neck was performed using the standard protocol during bolus administration of intravenous contrast. Multiplanar CT image reconstructions and MIPs were obtained to evaluate the vascular anatomy. Carotid stenosis measurements (when applicable) are obtained utilizing NASCET criteria, using the distal internal carotid diameter as the denominator. CONTRAST:  88m OMNIPAQUE IOHEXOL 350 MG/ML SOLN COMPARISON:  Noncontrast head CT and carotid ultrasound earlier today FINDINGS: CT HEAD Brain: There is no evidence of acute large territory infarct, intracranial hemorrhage, mass, midline shift, or extra-axial fluid collection on this contrast-enhanced study. Mild cerebral atrophy is noted. Patchy hypodensities in the subcortical and deep cerebral white matter are nonspecific but compatible with moderate chronic small vessel ischemic disease with chronic  lacunar infarcts. Calvarium and skull base: No skull fracture. Paranasal sinuses: Small amount of fluid in the maxillary and sphenoid sinuses bilaterally and in the left frontal sinus. Mild bilateral ethmoid air cell mucosal thickening. Orbits: Prior bilateral cataract extraction. CTA NECK Aortic arch: 3 vessel aortic arch with mild atherosclerotic plaque. Brachiocephalic and subclavian arteries are patent without significant stenosis. Right carotid system: Patent without evidence of stenosis, dissection, or aneurysm. Trace calcified plaque at the bifurcation. Mildly tortuous cervical ICA. Left carotid system: Common carotid artery is patent at its origin but demonstrates progressively diminishing opacification with only faint contrast present distally. There is complete occlusion at the carotid bifurcation. The ICA is completely occluded in the neck. There is some reconstitution of left ECA branches distally. Vertebral arteries: The vertebral arteries are patent with the right being dominant. Scattered atherosclerotic plaque  is noted without significant stenosis. Skeleton: Solid intervertebral osseous fusion at C5-6. Slight anterolisthesis of C3 on C4 due to advanced facet arthrosis, left worse than right. Other neck: Mild biapical pleural-parenchymal scarring in the lungs. Ground-glass and patchy consolidative opacities in both lung apices with a small right pleural effusion, incompletely imaged. CTA HEAD Anterior circulation: The right internal carotid artery is patent from skullbase to carotid terminus with ectasia of the anterior cavernous and proximal supraclinoid segments. There is a mild calcified plaque without significant stenosis. The proximal left intracranial ICA is occluded with reconstitution of the supraclinoid segment by the posterior communicating artery. ACAs and MCAs are patent without evidence of significant stenosis or major branch vessel occlusion. No intracranial aneurysm is identified.  Posterior circulation: Intracranial vertebral arteries are patent to the basilar with the right being dominant. PICA origins, right AICA origin, and bilateral SCA origins are patent. Basilar artery is patent without stenosis. There is a small left posterior communicating artery. PCAs are unremarkable. Venous sinuses: Patent. Anatomic variants: None. Delayed phase: No abnormal enhancement. IMPRESSION: 1. Occlusion of the distal left common carotid artery/carotid bifurcation. 2. Reconstitution of the left ICA intracranially at the supraclinoid level. 3. No major intracranial arterial occlusion or significant intracranial stenosis. 4. Widely patent vertebral arteries and right cervical carotid arteries. 5. Partially visualized opacities in both lung apices which may reflect pulmonary edema as described on yesterday's chest radiographs. Small right pleural effusion. These results will be called to the ordering clinician or representative by the Radiologist Assistant, and communication documented in the PACS or zVision Dashboard. Electronically Signed   By: Logan Bores M.D.   On: 02/18/2015 16:38   US Carotid Bilateral  02/18/2015  CLINICAL DATA:  Right-sided weakness this morning. EXAM: BILATERAL CAROTID DUPLEX ULTRASOUND TECHNIQUE: Pearline Cables scale imaging, color Doppler and duplex ultrasound were performed of bilateral carotid and vertebral arteries in the neck. COMPARISON:  Head CT today.  Cervical spine CT 12/27/2014 FINDINGS: Criteria: Quantification of carotid stenosis is based on velocity parameters that correlate the residual internal carotid diameter with NASCET-based stenosis levels, using the diameter of the distal internal carotid lumen as the denominator for stenosis measurement. The following velocity measurements were obtained: RIGHT: ICA:  81 cm/sec CCA:  69 cm/sec SYSTOLIC ICA/CCA RATIO:  1.2 DIASTOLIC ICA/CCA RATIO:  1.5 ECA:  93 cm/sec LEFT ICA:  24 cm/sec CCA:  28 cm/sec SYSTOLIC ICA/CCA RATIO:  0.9  DIASTOLIC ICA/CCA RATIO:  1.4 ECA:  48 cm/sec RIGHT CAROTID ARTERY: Mild calcified plaque at the carotid bulb and proximal internal carotid artery. Normal Doppler waveforms. RIGHT VERTEBRAL ARTERY:  Normal antegrade flow and normal waveform. LEFT CAROTID ARTERY: Low amplitude high resistance waveform within the common carotid artery. Mild calcified plaque at the carotid bulb and proximal internal carotid artery. Echogenic material on grayscale images throughout the visualized internal carotid artery with very slow flow detected and "thud" flow waveform indicating near complete occlusion versus complete occlusion. LEFT VERTEBRAL ARTERY:  Normal antegrade flow with normal waveform. IMPRESSION: Mild calcified atherosclerotic plaque at the carotid bulbs and proximal internal carotid arteries bilaterally. Findings compatible with complete versus near complete occlusion of the left internal carotid artery. CTA may be helpful to make the distinction between near near complete occlusion versus complete occlusion. No hemodynamically significant stenosis of the right internal carotid artery. Normal flow within the vertebral arteries. These results were called by telephone at the time of interpretation on 02/18/2015 at 2:50 pm to Dr. Loletha Grayer , who verbally acknowledged these  results. Electronically Signed   By: Marin Olp M.D.   On: 02/18/2015 14:52    Review of Systems  Constitutional: Negative.   Eyes: Negative.   Respiratory: Positive for cough and shortness of breath. Negative for hemoptysis, sputum production and wheezing.   Cardiovascular: Negative.   Gastrointestinal: Negative.   Genitourinary: Negative.   Musculoskeletal: Positive for back pain and neck pain. Negative for myalgias, joint pain and falls.  Skin: Negative.   Neurological: Positive for focal weakness. Negative for dizziness, tingling, tremors, sensory change, speech change, seizures and loss of consciousness.   Psychiatric/Behavioral: Negative.    Blood pressure 140/70, pulse 91, temperature 97.9 F (36.6 C), temperature source Oral, resp. rate 18, height 5' 1"  (1.549 m), weight 61.916 kg (136 lb 8 oz), SpO2 98 %. Physical Exam  Nursing note and vitals reviewed. Constitutional: She appears well-developed and well-nourished. No distress.  HENT:  Head: Normocephalic and atraumatic.  Right Ear: External ear normal.  Left Ear: External ear normal.  Nose: Nose normal.  Mouth/Throat: Oropharynx is clear and moist.  Eyes: Conjunctivae and EOM are normal. Pupils are equal, round, and reactive to light. No scleral icterus.  Neck: Normal range of motion. Neck supple.  Cardiovascular: Normal rate, regular rhythm, normal heart sounds and intact distal pulses.   No murmur heard. Respiratory: Effort normal and breath sounds normal. No respiratory distress. She has no wheezes.  GI: Soft. Bowel sounds are normal. She exhibits no distension.  Musculoskeletal: Normal range of motion. She exhibits tenderness. She exhibits no edema.  Neurological:  A+Ox4, nl speech and language PERRLA, EOMI, nl VF, face symmetric, tongue midline Mild R UE drift, R LE decreased ROM due to pain, nl strenght on L, nl tone FTN WNL 3+/4 B, Babinski B, neg hoffman No sensory level, nl vibration  Skin: Skin is warm. She is not diaphoretic.  Psychiatric: She has a normal mood and affect.   CT of head personally reviewed by me and shows L carotid occlusion and mild white matter changes  Assessment/Plan: 1.  Probable cervical myelopathy-  This would explain weakness and chronic neck pain on top of hyperreflexia on exam;  Expect this more than a stroke but both are possible 2.  L carotid occlusion-  Asymptomatic and chronic as of now 3.  Mild white matter changes-  Stable -  MRI of c-spine and brain w/o contrast -  Check TSH, B12, ESR, lipids and hem A1c -  Naproxen 352m BID for pain -  Pending echo -  Will follow  Lesslie Mckeehan,  Shamaine Mulkern 02/19/2015, 4:48 PM

## 2015-02-19 NOTE — Progress Notes (Signed)
Rehab Admissions Coordinator Note:  Patient was screened by Clois DupesBoyette, Alma Mohiuddin Godwin for appropriateness for an Inpatient Acute Rehab Consult per PT and OT recommendation. At this time, we are recommending await Neuro consult and then I will follow up. Need clarification of diagnosis to assist in determining if pt would be a possible candidate for an inpt acute rehab admission at [facility]. I will follow up with Dr. Renae GlossWieting after Neuro consult. I have discussed with RN CM . Please call me with any questions [phone].  Clois DupesBoyette, Katharyn Schauer Godwin 02/19/2015, 3:50 PM

## 2015-02-19 NOTE — Progress Notes (Signed)
Patient ID: Savannah Friedman, female   DOB: 1924-02-19, 81 y.o.   MRN: [id number] Savannah Friedman PROGRESS NOTE  Savannah Friedman DOB: 02/17/1924 DOA: 02/17/2015 PCP: Savannah Ferrier, MD  HPI/Subjective: Patient feeling a little bit better today. Her right arm is a little bit stronger. She has a bursitis on her right hip and that is always a little bit weaker. Coordination with the right hand is a little bit better. Her breathing is a little bit better still cough and shortness of breath.  Objective: Filed Vitals:   02/19/15 1314 02/19/15 1530  BP: 122/58 140/70  Pulse: 96 91  Temp: 97.7 F (36.5 C) 97.9 F (36.6 C)  Resp: 17 18    Filed Weights   02/17/15 1045  Weight: 61.916 kg (136 lb 8 oz)    ROS: Review of Systems  Constitutional: Positive for malaise/fatigue. Negative for fever and chills.  Eyes: Negative for blurred vision.  Respiratory: Positive for cough, shortness of breath and wheezing.   Cardiovascular: Negative for chest pain.  Gastrointestinal: Negative for nausea, vomiting, abdominal pain, diarrhea and constipation.  Genitourinary: Negative for dysuria.  Musculoskeletal: Negative for joint pain.  Neurological: Positive for focal weakness and weakness. Negative for dizziness and headaches.   Exam: Physical Exam  Constitutional: She is oriented to person, place, and time.  HENT:  Nose: No mucosal edema.  Mouth/Throat: No oropharyngeal exudate or posterior oropharyngeal edema.  Eyes: Conjunctivae, EOM and lids are normal. Pupils are equal, round, and reactive to light.  Neck: No JVD present. Carotid bruit is not present. No edema present. No thyroid mass and no thyromegaly present.  Cardiovascular: S1 normal and S2 normal.  Exam reveals no gallop.   No murmur heard. Pulses:      Dorsalis pedis pulses are 2+ on the right side, and 2+ on the left side.  Respiratory: No respiratory distress. She has decreased breath sounds in the right  lower field and the left lower field. She has wheezes in the right lower field and the left lower field. She has no rhonchi. She has no rales.  GI: Soft. Bowel sounds are normal. There is no tenderness.  Musculoskeletal:       Right ankle: She exhibits no swelling.       Left ankle: She exhibits no swelling.  Lymphadenopathy:    She has no cervical adenopathy.  Neurological: She is alert and oriented to person, place, and time. No cranial nerve deficit.  Right hand coordination will better today. Power 5 out of 5 right upper extremity. Difficulty with right leg straight leg raise secondary to hip bursitis.  Skin: Skin is warm. No rash noted. Nails show no clubbing.  Psychiatric: She has a normal mood and affect.    Data Reviewed: Basic Metabolic Panel:  Recent Labs Lab 02/17/15 1048 02/18/15 0503  NA 135 139  K 3.8 3.3*  CL 100* 104  CO2 26 27  GLUCOSE 110* 137*  BUN 18 15  CREATININE 0.73 0.64  CALCIUM 8.4* 8.0*  MG 1.8  --   PHOS 3.0  --    Liver Function Tests:  Recent Labs Lab 02/17/15 1048  AST 22  ALT 10*  ALKPHOS 58  BILITOT 1.0  PROT 6.5  ALBUMIN 4.0    Recent Labs Lab 02/17/15 1048  LIPASE 31   CBC:  Recent Labs Lab 02/17/15 1048 02/18/15 0503 02/19/15 0135  WBC 3.8 6.3 4.2  NEUTROABS 3.1  --   --  HGB 13.3 12.4 13.5  HCT 39.7 36.8 40.8  MCV 93.6 91.9 93.2  PLT 137* 121* 153     Studies: Ct Angio Head W/cm &/or Wo Cm  02/18/2015  CLINICAL DATA:  Right-sided weakness. Possible stroke. Carotid stenosis. Near complete versus complete occlusion of the left ICA on ultrasound. EXAM: CT ANGIOGRAPHY HEAD AND NECK TECHNIQUE: Multidetector CT imaging of the head and neck was performed using the standard protocol during bolus administration of intravenous contrast. Multiplanar CT image reconstructions and MIPs were obtained to evaluate the vascular anatomy. Carotid stenosis measurements (when applicable) are obtained utilizing NASCET criteria, using  the distal internal carotid diameter as the denominator. CONTRAST:  75mL OMNIPAQUE IOHEXOL 350 MG/ML SOLN COMPARISON:  Noncontrast head CT and carotid ultrasound earlier today FINDINGS: CT HEAD Brain: There is no evidence of acute large territory infarct, intracranial hemorrhage, mass, midline shift, or extra-axial fluid collection on this contrast-enhanced study. Mild cerebral atrophy is noted. Patchy hypodensities in the subcortical and deep cerebral white matter are nonspecific but compatible with moderate chronic small vessel ischemic disease with chronic lacunar infarcts. Calvarium and skull base: No skull fracture. Paranasal sinuses: Small amount of fluid in the maxillary and sphenoid sinuses bilaterally and in the left frontal sinus. Mild bilateral ethmoid air cell mucosal thickening. Orbits: Prior bilateral cataract extraction. CTA NECK Aortic arch: 3 vessel aortic arch with mild atherosclerotic plaque. Brachiocephalic and subclavian arteries are patent without significant stenosis. Right carotid system: Patent without evidence of stenosis, dissection, or aneurysm. Trace calcified plaque at the bifurcation. Mildly tortuous cervical ICA. Left carotid system: Common carotid artery is patent at its origin but demonstrates progressively diminishing opacification with only faint contrast present distally. There is complete occlusion at the carotid bifurcation. The ICA is completely occluded in the neck. There is some reconstitution of left ECA branches distally. Vertebral arteries: The vertebral arteries are patent with the right being dominant. Scattered atherosclerotic plaque is noted without significant stenosis. Skeleton: Solid intervertebral osseous fusion at C5-6. Slight anterolisthesis of C3 on C4 due to advanced facet arthrosis, left worse than right. Other neck: Mild biapical pleural-parenchymal scarring in the lungs. Ground-glass and patchy consolidative opacities in both lung apices with a small right  pleural effusion, incompletely imaged. CTA HEAD Anterior circulation: The right internal carotid artery is patent from skullbase to carotid terminus with ectasia of the anterior cavernous and proximal supraclinoid segments. There is a mild calcified plaque without significant stenosis. The proximal left intracranial ICA is occluded with reconstitution of the supraclinoid segment by the posterior communicating artery. ACAs and MCAs are patent without evidence of significant stenosis or major branch vessel occlusion. No intracranial aneurysm is identified. Posterior circulation: Intracranial vertebral arteries are patent to the basilar with the right being dominant. PICA origins, right AICA origin, and bilateral SCA origins are patent. Basilar artery is patent without stenosis. There is a small left posterior communicating artery. PCAs are unremarkable. Venous sinuses: Patent. Anatomic variants: None. Delayed phase: No abnormal enhancement. IMPRESSION: 1. Occlusion of the distal left common carotid artery/carotid bifurcation. 2. Reconstitution of the left ICA intracranially at the supraclinoid level. 3. No major intracranial arterial occlusion or significant intracranial stenosis. 4. Widely patent vertebral arteries and right cervical carotid arteries. 5. Partially visualized opacities in both lung apices which may reflect pulmonary edema as described on yesterday's chest radiographs. Small right pleural effusion. These results will be called to the ordering clinician or representative by the Radiologist Assistant, and communication documented in the PACS or zVision Dashboard.  Electronically Signed   By: Sebastian Ache M.D.   On: 02/18/2015 16:38   Ct Head Wo Contrast  02/18/2015  CLINICAL DATA:  Pt with new onset of right sided weakness. Pt states she was unable to use her fork with her right hand while eating breakfast today. Pt states she was fine last night when she went to sleep. Pt denies hx of stroke or  seizure. EXAM: CT HEAD WITHOUT CONTRAST TECHNIQUE: Contiguous axial images were obtained from the base of the skull through the vertex without intravenous contrast. COMPARISON:  Head CT 01/20/2015 FINDINGS: No acute intracranial hemorrhage. No focal mass lesion. No CT evidence of acute infarction. No midline shift or mass effect. No hydrocephalus. Basilar cisterns are patent. Charles cortical atrophy. There is mild periventricular white matter hypodensities. Paranasal sinuses and  mastoid air cells are clear. IMPRESSION: 1. No acute intracranial findings. 2. Atrophy and white matter microvascular disease. Electronically Signed   By: Genevive Bi M.D.   On: 02/18/2015 14:31   Ct Angio Neck W/cm &/or Wo/cm  02/18/2015  CLINICAL DATA:  Right-sided weakness. Possible stroke. Carotid stenosis. Near complete versus complete occlusion of the left ICA on ultrasound. EXAM: CT ANGIOGRAPHY HEAD AND NECK TECHNIQUE: Multidetector CT imaging of the head and neck was performed using the standard protocol during bolus administration of intravenous contrast. Multiplanar CT image reconstructions and MIPs were obtained to evaluate the vascular anatomy. Carotid stenosis measurements (when applicable) are obtained utilizing NASCET criteria, using the distal internal carotid diameter as the denominator. CONTRAST:  75mL OMNIPAQUE IOHEXOL 350 MG/ML SOLN COMPARISON:  Noncontrast head CT and carotid ultrasound earlier today FINDINGS: CT HEAD Brain: There is no evidence of acute large territory infarct, intracranial hemorrhage, mass, midline shift, or extra-axial fluid collection on this contrast-enhanced study. Mild cerebral atrophy is noted. Patchy hypodensities in the subcortical and deep cerebral white matter are nonspecific but compatible with moderate chronic small vessel ischemic disease with chronic lacunar infarcts. Calvarium and skull base: No skull fracture. Paranasal sinuses: Small amount of fluid in the maxillary and  sphenoid sinuses bilaterally and in the left frontal sinus. Mild bilateral ethmoid air cell mucosal thickening. Orbits: Prior bilateral cataract extraction. CTA NECK Aortic arch: 3 vessel aortic arch with mild atherosclerotic plaque. Brachiocephalic and subclavian arteries are patent without significant stenosis. Right carotid system: Patent without evidence of stenosis, dissection, or aneurysm. Trace calcified plaque at the bifurcation. Mildly tortuous cervical ICA. Left carotid system: Common carotid artery is patent at its origin but demonstrates progressively diminishing opacification with only faint contrast present distally. There is complete occlusion at the carotid bifurcation. The ICA is completely occluded in the neck. There is some reconstitution of left ECA branches distally. Vertebral arteries: The vertebral arteries are patent with the right being dominant. Scattered atherosclerotic plaque is noted without significant stenosis. Skeleton: Solid intervertebral osseous fusion at C5-6. Slight anterolisthesis of C3 on C4 due to advanced facet arthrosis, left worse than right. Other neck: Mild biapical pleural-parenchymal scarring in the lungs. Ground-glass and patchy consolidative opacities in both lung apices with a small right pleural effusion, incompletely imaged. CTA HEAD Anterior circulation: The right internal carotid artery is patent from skullbase to carotid terminus with ectasia of the anterior cavernous and proximal supraclinoid segments. There is a mild calcified plaque without significant stenosis. The proximal left intracranial ICA is occluded with reconstitution of the supraclinoid segment by the posterior communicating artery. ACAs and MCAs are patent without evidence of significant stenosis or major branch vessel occlusion.  No intracranial aneurysm is identified. Posterior circulation: Intracranial vertebral arteries are patent to the basilar with the right being dominant. PICA origins,  right AICA origin, and bilateral SCA origins are patent. Basilar artery is patent without stenosis. There is a small left posterior communicating artery. PCAs are unremarkable. Venous sinuses: Patent. Anatomic variants: None. Delayed phase: No abnormal enhancement. IMPRESSION: 1. Occlusion of the distal left common carotid artery/carotid bifurcation. 2. Reconstitution of the left ICA intracranially at the supraclinoid level. 3. No major intracranial arterial occlusion or significant intracranial stenosis. 4. Widely patent vertebral arteries and right cervical carotid arteries. 5. Partially visualized opacities in both lung apices which may reflect pulmonary edema as described on yesterday's chest radiographs. Small right pleural effusion. These results will be called to the ordering clinician or representative by the Radiologist Assistant, and communication documented in the PACS or zVision Dashboard. Electronically Signed   By: Sebastian Ache M.D.   On: 02/18/2015 16:38   US Carotid Bilateral  02/18/2015  CLINICAL DATA:  Right-sided weakness this morning. EXAM: BILATERAL CAROTID DUPLEX ULTRASOUND TECHNIQUE: Wallace Cullens scale imaging, color Doppler and duplex ultrasound were performed of bilateral carotid and vertebral arteries in the neck. COMPARISON:  Head CT today.  Cervical spine CT 12/27/2014 FINDINGS: Criteria: Quantification of carotid stenosis is based on velocity parameters that correlate the residual internal carotid diameter with NASCET-based stenosis levels, using the diameter of the distal internal carotid lumen as the denominator for stenosis measurement. The following velocity measurements were obtained: RIGHT: ICA:  81 cm/sec CCA:  69 cm/sec SYSTOLIC ICA/CCA RATIO:  1.2 DIASTOLIC ICA/CCA RATIO:  1.5 ECA:  93 cm/sec LEFT ICA:  24 cm/sec CCA:  28 cm/sec SYSTOLIC ICA/CCA RATIO:  0.9 DIASTOLIC ICA/CCA RATIO:  1.4 ECA:  48 cm/sec RIGHT CAROTID ARTERY: Mild calcified plaque at the carotid bulb and proximal  internal carotid artery. Normal Doppler waveforms. RIGHT VERTEBRAL ARTERY:  Normal antegrade flow and normal waveform. LEFT CAROTID ARTERY: Low amplitude high resistance waveform within the common carotid artery. Mild calcified plaque at the carotid bulb and proximal internal carotid artery. Echogenic material on grayscale images throughout the visualized internal carotid artery with very slow flow detected and "thud" flow waveform indicating near complete occlusion versus complete occlusion. LEFT VERTEBRAL ARTERY:  Normal antegrade flow with normal waveform. IMPRESSION: Mild calcified atherosclerotic plaque at the carotid bulbs and proximal internal carotid arteries bilaterally. Findings compatible with complete versus near complete occlusion of the left internal carotid artery. CTA may be helpful to make the distinction between near near complete occlusion versus complete occlusion. No hemodynamically significant stenosis of the right internal carotid artery. Normal flow within the vertebral arteries. These results were called by telephone at the time of interpretation on 02/18/2015 at 2:50 pm to Dr. Alford Highland , who verbally acknowledged these results. Electronically Signed   By: Elberta Fortis M.D.   On: 02/18/2015 14:52    Scheduled Meds: . aspirin EC  81 mg Oral Daily  . atorvastatin  40 mg Oral q1800  . budesonide (PULMICORT) nebulizer solution  0.25 mg Nebulization BID  . [START ON 02/20/2015] Influenza vac split quadrivalent PF  0.5 mL Intramuscular Tomorrow-1000  . ipratropium-albuterol  3 mL Nebulization Q4H  . levofloxacin (LEVAQUIN) IV  250 mg Intravenous Q24H  . methylPREDNISolone (SOLU-MEDROL) injection  40 mg Intravenous Q8H  . warfarin  5 mg Oral q1800    Assessment/Plan:  1. Right-sided weakness. Left carotid stenosis. No stroke seen on CT angiogram. Continue heparin drip and start Coumadin.  Patient on aspirin also. Likely can stop this soon. PT, OT and neuro  consultations 2. Acute bronchitis with asthmatic component- Rocephin and Zithromax. Add low-dose IV Medrol and budesonide nebulizers 3. Acute diastolic congestive heart failure with moderate mitral valve regurgitation. Hold on Lasix at this point. No beta blocker with bronchospasm. 4. Thrombocytopenia- patient's platelet count has been variable over the years. Continue to monitor  Code Status:     Code Status Orders        Start     Ordered   02/17/15 1545  Full code   Continuous     02/17/15 1544     Family Communication: Son at bedside Disposition Plan: To be determined  Consultants:  Neurology  Antibiotics: Rocephin and Zithromax  Time spent: 30 minutes  Alford HighlandWIETING, Jaaron Oleson  [facility]

## 2015-02-19 NOTE — Progress Notes (Signed)
ANTICOAGULATION CONSULT NOTE - FOLLOW UP   Pharmacy Consult for Heparin  Indication: Carotid artery stenosis  Allergies  Allergen Reactions  . Codeine Nausea And Vomiting    Patient Measurements: Height: 5\' 1"  (154.9 cm) Weight: 136 lb 8 oz (61.916 kg) IBW/kg (Calculated) : 47.8 Heparin Dosing Weight: 60.4 kg   Vital Signs: Temp: 97.5 F (36.4 C) (11/28 0451) Temp Source: Oral (11/28 0451) BP: 131/53 mmHg (11/28 0451) Pulse Rate: 96 (11/28 0451)  Labs:  Recent Labs  02/17/15 1048 02/18/15 0503 02/19/15 0135 02/19/15 0957  HGB 13.3 12.4 13.5  --   HCT 39.7 36.8 40.8  --   PLT 137* 121* 153  --   HEPARINUNFRC  --   --  0.56 0.51  CREATININE 0.73 0.64  --   --   TROPONINI <0.03  --   --   --     Estimated Creatinine Clearance: 40.2 mL/min (by C-G formula based on Cr of 0.64).   Medical History: Past Medical History  Diagnosis Date  . Anemia     Medications:  No prescriptions prior to admission    Assessment: Pharmacy consulted to dose heparin in this 81 year old female admitted with carotid artery stenosis.   CrCl =  40.2 ml/min  Goal of Therapy:  Heparin level 0.3-0.7 units/ml Monitor platelets by anticoagulation protocol: Yes   Plan:  1128 0135 heparin level therapeutic. Continue current rate and will draw confirmatory level in 8 hours.  11/28 @ 0957:  Heparin gtt resulted @ 0.51. Will continue current rate of heparin gtt @ 750 units/hr.   Tirza Senteno D, Pharm.D., BCPS Clinical Pharmacist 02/19/2015,10:48 AM

## 2015-02-19 NOTE — Care Management Important Message (Signed)
Important Message  Patient Details  Name: Savannah Friedman MRN: [id number] Date of Birth: 02-25-1924   Medicare Important Message Given:  Yes    [name] 02/19/2015, 10:38 AM

## 2015-02-20 ENCOUNTER — Inpatient Hospital Stay: Payer: Medicare Other

## 2015-02-20 ENCOUNTER — Inpatient Hospital Stay (HOSPITAL_COMMUNITY): Payer: Medicare Other

## 2015-02-20 LAB — HEPARIN LEVEL (UNFRACTIONATED): Heparin Unfractionated: 0.59 IU/mL (ref 0.30–0.70)

## 2015-02-20 LAB — CBC
HCT: 40.6 % (ref 35.0–47.0)
HEMOGLOBIN: 14 g/dL (ref 12.0–16.0)
MCH: 31.5 pg (ref 26.0–34.0)
MCHC: 34.4 g/dL (ref 32.0–36.0)
MCV: 91.5 fL (ref 80.0–100.0)
Platelets: 196 10*3/uL (ref 150–440)
RBC: 4.44 MIL/uL (ref 3.80–5.20)
RDW: 13.8 % (ref 11.5–14.5)
WBC: 10.7 10*3/uL (ref 3.6–11.0)

## 2015-02-20 LAB — VITAMIN B12: Vitamin B-12: 595 pg/mL (ref 180–914)

## 2015-02-20 LAB — HEMOGLOBIN A1C: Hgb A1c MFr Bld: 5.5 % (ref 4.0–6.0)

## 2015-02-20 LAB — PROTIME-INR
INR: 1
Prothrombin Time: 13.4 seconds (ref 11.4–15.0)

## 2015-02-20 MED ORDER — FUROSEMIDE 20 MG PO TABS
20.0000 mg | ORAL_TABLET | Freq: Every day | ORAL | Status: DC
  Administered 2015-02-21 – 2015-02-22 (×2): 20 mg via ORAL
  Filled 2015-02-20 (×2): qty 1

## 2015-02-20 MED ORDER — IPRATROPIUM-ALBUTEROL 0.5-2.5 (3) MG/3ML IN SOLN
3.0000 mL | Freq: Three times a day (TID) | RESPIRATORY_TRACT | Status: DC
  Administered 2015-02-21 – 2015-02-23 (×8): 3 mL via RESPIRATORY_TRACT
  Filled 2015-02-20 (×8): qty 3

## 2015-02-20 NOTE — Progress Notes (Signed)
ANTICOAGULATION CONSULT NOTE - FOLLOW UP   Pharmacy Consult for Heparin  Indication: Carotid artery stenosis  Allergies  Allergen Reactions  . Codeine Nausea And Vomiting    Patient Measurements: Height: 5\' 1"  (154.9 cm) Weight: 136 lb 8 oz (61.916 kg) IBW/kg (Calculated) : 47.8 Heparin Dosing Weight: 60.4 kg   Vital Signs: Temp: 97.5 F (36.4 C) (11/28 2050) Temp Source: Oral (11/28 2050) BP: 127/55 mmHg (11/28 2050) Pulse Rate: 87 (11/28 2050)  Labs:  Recent Labs  02/17/15 1048 02/18/15 0503 02/19/15 0135 02/19/15 0957 02/19/15 1535 02/20/15 0435  HGB 13.3 12.4 13.5  --   --  14.0  HCT 39.7 36.8 40.8  --   --  40.6  PLT 137* 121* 153  --   --  196  LABPROT  --   --   --   --  12.8 13.4  INR  --   --   --   --  0.94 1.00  HEPARINUNFRC  --   --  0.56 0.51  --  0.59  CREATININE 0.73 0.64  --   --   --   --   TROPONINI <0.03  --   --   --   --   --     Estimated Creatinine Clearance: 40.2 mL/min (by C-G formula based on Cr of 0.64).   Medical History: Past Medical History  Diagnosis Date  . Anemia     Medications:  No prescriptions prior to admission    Assessment: Pharmacy consulted to dose heparin in this 81 year old female admitted with carotid artery stenosis.   CrCl =  40.2 ml/min  Goal of Therapy:  Heparin level 0.3-0.7 units/ml Monitor platelets by anticoagulation protocol: Yes   Plan:  1128 0135 heparin level therapeutic. Continue current rate and will draw confirmatory level in 8 hours.  11/28 @ 0957:  Heparin gtt resulted @ 0.51. Will continue current rate of heparin gtt @ 750 units/hr.   11/29 AM heparin level 0.59. Continue current regimen and recheck heparin level and CBC in AM.  Moni Rothrock S, Pharm.D., BCPS Clinical Pharmacist 02/20/2015,5:58 AM

## 2015-02-20 NOTE — Plan of Care (Signed)
Problem: Respiratory: Goal: Pain level will decrease Outcome: Not Progressing Chronic pain- bursitis in right hip     Problem: Pain Managment: Goal: General experience of comfort will improve Outcome: Not Progressing Chronic pain-bursitis in right hip     Problem: Activity: Goal: Risk for activity intolerance will decrease Outcome: Not Progressing Difficulty ambulating at this time

## 2015-02-20 NOTE — Progress Notes (Signed)
Patient ID: Savannah Friedman, female   DOB: 01/25/1924, 81 y.o.   MRN: [id number] [facility] PROGRESS NOTE  Savannah Friedman WUJ:[id number] DOB: [birth year] DOA: 02/17/2015 PCP: Lynnea Ferrier, MD  HPI/Subjective: Patient thought she was doing okay but they had to put back the oxygen. She is still not feeling her breathing is quite right. Still with some cough and shortness of breath. Right side a little bit weak.  Objective: Filed Vitals:   02/19/15 2050 02/20/15 0908  BP: 127/55 126/64  Pulse: 87 99  Temp: 97.5 F (36.4 C) 97.9 F (36.6 C)  Resp: 20 18    Filed Weights   02/17/15 1045  Weight: 61.916 kg (136 lb 8 oz)    ROS: Review of Systems  Constitutional: Positive for malaise/fatigue. Negative for fever and chills.  Eyes: Negative for blurred vision.  Respiratory: Positive for cough, shortness of breath and wheezing.   Cardiovascular: Negative for chest pain.  Gastrointestinal: Negative for nausea, vomiting, abdominal pain, diarrhea and constipation.  Genitourinary: Negative for dysuria.  Musculoskeletal: Negative for joint pain.  Neurological: Positive for tremors, focal weakness and weakness. Negative for dizziness and headaches.   Exam: Physical Exam  Constitutional: She is oriented to person, place, and time.  HENT:  Nose: No mucosal edema.  Mouth/Throat: No oropharyngeal exudate or posterior oropharyngeal edema.  Eyes: Conjunctivae, EOM and lids are normal. Pupils are equal, round, and reactive to light.  Neck: No JVD present. Carotid bruit is not present. No edema present. No thyroid mass and no thyromegaly present.  Cardiovascular: S1 normal and S2 normal.  Exam reveals no gallop.   No murmur heard. Pulses:      Dorsalis pedis pulses are 2+ on the right side, and 2+ on the left side.  Respiratory: No respiratory distress. She has decreased breath sounds in the right lower field and the left lower field. She has no wheezes. She has no rhonchi.  She has rales in the right lower field and the left lower field.  GI: Soft. Bowel sounds are normal. There is no tenderness.  Musculoskeletal:       Right ankle: She exhibits no swelling.       Left ankle: She exhibits no swelling.  Lymphadenopathy:    She has no cervical adenopathy.  Neurological: She is alert and oriented to person, place, and time. No cranial nerve deficit.  Right hand coordination will better today. Power 5 out of 5 right upper extremity. Difficulty with right leg straight leg raise secondary to hip bursitis.  Skin: Skin is warm. No rash noted. Nails show no clubbing.  Psychiatric: She has a normal mood and affect.    Data Reviewed: Basic Metabolic Panel:  Recent Labs Lab 02/17/15 1048 02/18/15 0503  NA 135 139  K 3.8 3.3*  CL 100* 104  CO2 26 27  GLUCOSE 110* 137*  BUN 18 15  CREATININE 0.73 0.64  CALCIUM 8.4* 8.0*  MG 1.8  --   PHOS 3.0  --    Liver Function Tests:  Recent Labs Lab 02/17/15 1048  AST 22  ALT 10*  ALKPHOS 58  BILITOT 1.0  PROT 6.5  ALBUMIN 4.0    Recent Labs Lab 02/17/15 1048  LIPASE 31   CBC:  Recent Labs Lab 02/17/15 1048 02/18/15 0503 02/19/15 0135 02/20/15 0435  WBC 3.8 6.3 4.2 10.7  NEUTROABS 3.1  --   --   --   HGB 13.3 12.4 13.5 14.0  HCT 39.7  36.8 40.8 40.6  MCV 93.6 91.9 93.2 91.5  PLT 137* 121* 153 196     Studies: Ct Angio Head W/cm &/or Wo Cm  02/18/2015  CLINICAL DATA:  Right-sided weakness. Possible stroke. Carotid stenosis. Near complete versus complete occlusion of the left ICA on ultrasound. EXAM: CT ANGIOGRAPHY HEAD AND NECK TECHNIQUE: Multidetector CT imaging of the head and neck was performed using the standard protocol during bolus administration of intravenous contrast. Multiplanar CT image reconstructions and MIPs were obtained to evaluate the vascular anatomy. Carotid stenosis measurements (when applicable) are obtained utilizing NASCET criteria, using the distal internal carotid  diameter as the denominator. CONTRAST:  75mL OMNIPAQUE IOHEXOL 350 MG/ML SOLN COMPARISON:  Noncontrast head CT and carotid ultrasound earlier today FINDINGS: CT HEAD Brain: There is no evidence of acute large territory infarct, intracranial hemorrhage, mass, midline shift, or extra-axial fluid collection on this contrast-enhanced study. Mild cerebral atrophy is noted. Patchy hypodensities in the subcortical and deep cerebral white matter are nonspecific but compatible with moderate chronic small vessel ischemic disease with chronic lacunar infarcts. Calvarium and skull base: No skull fracture. Paranasal sinuses: Small amount of fluid in the maxillary and sphenoid sinuses bilaterally and in the left frontal sinus. Mild bilateral ethmoid air cell mucosal thickening. Orbits: Prior bilateral cataract extraction. CTA NECK Aortic arch: 3 vessel aortic arch with mild atherosclerotic plaque. Brachiocephalic and subclavian arteries are patent without significant stenosis. Right carotid system: Patent without evidence of stenosis, dissection, or aneurysm. Trace calcified plaque at the bifurcation. Mildly tortuous cervical ICA. Left carotid system: Common carotid artery is patent at its origin but demonstrates progressively diminishing opacification with only faint contrast present distally. There is complete occlusion at the carotid bifurcation. The ICA is completely occluded in the neck. There is some reconstitution of left ECA branches distally. Vertebral arteries: The vertebral arteries are patent with the right being dominant. Scattered atherosclerotic plaque is noted without significant stenosis. Skeleton: Solid intervertebral osseous fusion at C5-6. Slight anterolisthesis of C3 on C4 due to advanced facet arthrosis, left worse than right. Other neck: Mild biapical pleural-parenchymal scarring in the lungs. Ground-glass and patchy consolidative opacities in both lung apices with a small right pleural effusion,  incompletely imaged. CTA HEAD Anterior circulation: The right internal carotid artery is patent from skullbase to carotid terminus with ectasia of the anterior cavernous and proximal supraclinoid segments. There is a mild calcified plaque without significant stenosis. The proximal left intracranial ICA is occluded with reconstitution of the supraclinoid segment by the posterior communicating artery. ACAs and MCAs are patent without evidence of significant stenosis or major branch vessel occlusion. No intracranial aneurysm is identified. Posterior circulation: Intracranial vertebral arteries are patent to the basilar with the right being dominant. PICA origins, right AICA origin, and bilateral SCA origins are patent. Basilar artery is patent without stenosis. There is a small left posterior communicating artery. PCAs are unremarkable. Venous sinuses: Patent. Anatomic variants: None. Delayed phase: No abnormal enhancement. IMPRESSION: 1. Occlusion of the distal left common carotid artery/carotid bifurcation. 2. Reconstitution of the left ICA intracranially at the supraclinoid level. 3. No major intracranial arterial occlusion or significant intracranial stenosis. 4. Widely patent vertebral arteries and right cervical carotid arteries. 5. Partially visualized opacities in both lung apices which may reflect pulmonary edema as described on yesterday's chest radiographs. Small right pleural effusion. These results will be called to the ordering clinician or representative by the Radiologist Assistant, and communication documented in the PACS or zVision Dashboard. Electronically Signed  By: Sebastian Ache M.D.   On: 02/18/2015 16:38   Dg Knee 1-2 Views Left  02/19/2015  CLINICAL DATA:  Pre MRI clearance EXAM: LEFT KNEE - 1-2 VIEW; RIGHT KNEE - 1-2 VIEW COMPARISON:  None. FINDINGS: Right knee: The bones are adequately mineralized. There is narrowing of the medial joint compartment to a moderate degree with milder  narrowing laterally. There is no acute fracture nor dislocation. There is no joint effusion. No metallic foreign bodies are present. Left knee: There is a 6 cm long metallic pin through a longitudinally oriented through the medial third of the left patella for fixation of previous fracture. The bones are adequately mineralized. There is mild narrowing of the medial and lateral joint compartments. There is no joint effusion. There is no acute bony abnormality. IMPRESSION: 1. There is a 6 cm metallic pin that projects through the medial third of the body of the patella likely from previous fracture fixation. The MRI compatibility of this pin will merit investigation prior to MRI. The patient is not cleared for MRI. 2. The right knee exhibits no metallic foreign bodies. 3. Both knees exhibit mild to moderate osteoarthritic narrowing of the medial and lateral joint compartments. Electronically Signed   By: David  [place] M.D.   On: 02/19/2015 17:19   Dg Knee 1-2 Views Right  02/19/2015  CLINICAL DATA:  Pre MRI clearance EXAM: LEFT KNEE - 1-2 VIEW; RIGHT KNEE - 1-2 VIEW COMPARISON:  None. FINDINGS: Right knee: The bones are adequately mineralized. There is narrowing of the medial joint compartment to a moderate degree with milder narrowing laterally. There is no acute fracture nor dislocation. There is no joint effusion. No metallic foreign bodies are present. Left knee: There is a 6 cm long metallic pin through a longitudinally oriented through the medial third of the left patella for fixation of previous fracture. The bones are adequately mineralized. There is mild narrowing of the medial and lateral joint compartments. There is no joint effusion. There is no acute bony abnormality. IMPRESSION: 1. There is a 6 cm metallic pin that projects through the medial third of the body of the patella likely from previous fracture fixation. The MRI compatibility of this pin will merit investigation prior to MRI. The patient is  not cleared for MRI. 2. The right knee exhibits no metallic foreign bodies. 3. Both knees exhibit mild to moderate osteoarthritic narrowing of the medial and lateral joint compartments. Electronically Signed   By: David  [place] M.D.   On: 02/19/2015 17:19   Ct Head Wo Contrast  02/18/2015  CLINICAL DATA:  Pt with new onset of right sided weakness. Pt states she was unable to use her fork with her right hand while eating breakfast today. Pt states she was fine last night when she went to sleep. Pt denies hx of stroke or seizure. EXAM: CT HEAD WITHOUT CONTRAST TECHNIQUE: Contiguous axial images were obtained from the base of the skull through the vertex without intravenous contrast. COMPARISON:  Head CT 01/20/2015 FINDINGS: No acute intracranial hemorrhage. No focal mass lesion. No CT evidence of acute infarction. No midline shift or mass effect. No hydrocephalus. Basilar cisterns are patent. Charles cortical atrophy. There is mild periventricular white matter hypodensities. Paranasal sinuses and  mastoid air cells are clear. IMPRESSION: 1. No acute intracranial findings. 2. Atrophy and white matter microvascular disease. Electronically Signed   By: Genevive Bi M.D.   On: 02/18/2015 14:31   Ct Angio Neck W/cm &/or Wo/cm  02/18/2015  CLINICAL DATA:  Right-sided weakness. Possible stroke. Carotid stenosis. Near complete versus complete occlusion of the left ICA on ultrasound. EXAM: CT ANGIOGRAPHY HEAD AND NECK TECHNIQUE: Multidetector CT imaging of the head and neck was performed using the standard protocol during bolus administration of intravenous contrast. Multiplanar CT image reconstructions and MIPs were obtained to evaluate the vascular anatomy. Carotid stenosis measurements (when applicable) are obtained utilizing NASCET criteria, using the distal internal carotid diameter as the denominator. CONTRAST:  75mL OMNIPAQUE IOHEXOL 350 MG/ML SOLN COMPARISON:  Noncontrast head CT and carotid ultrasound  earlier today FINDINGS: CT HEAD Brain: There is no evidence of acute large territory infarct, intracranial hemorrhage, mass, midline shift, or extra-axial fluid collection on this contrast-enhanced study. Mild cerebral atrophy is noted. Patchy hypodensities in the subcortical and deep cerebral white matter are nonspecific but compatible with moderate chronic small vessel ischemic disease with chronic lacunar infarcts. Calvarium and skull base: No skull fracture. Paranasal sinuses: Small amount of fluid in the maxillary and sphenoid sinuses bilaterally and in the left frontal sinus. Mild bilateral ethmoid air cell mucosal thickening. Orbits: Prior bilateral cataract extraction. CTA NECK Aortic arch: 3 vessel aortic arch with mild atherosclerotic plaque. Brachiocephalic and subclavian arteries are patent without significant stenosis. Right carotid system: Patent without evidence of stenosis, dissection, or aneurysm. Trace calcified plaque at the bifurcation. Mildly tortuous cervical ICA. Left carotid system: Common carotid artery is patent at its origin but demonstrates progressively diminishing opacification with only faint contrast present distally. There is complete occlusion at the carotid bifurcation. The ICA is completely occluded in the neck. There is some reconstitution of left ECA branches distally. Vertebral arteries: The vertebral arteries are patent with the right being dominant. Scattered atherosclerotic plaque is noted without significant stenosis. Skeleton: Solid intervertebral osseous fusion at C5-6. Slight anterolisthesis of C3 on C4 due to advanced facet arthrosis, left worse than right. Other neck: Mild biapical pleural-parenchymal scarring in the lungs. Ground-glass and patchy consolidative opacities in both lung apices with a small right pleural effusion, incompletely imaged. CTA HEAD Anterior circulation: The right internal carotid artery is patent from skullbase to carotid terminus with ectasia  of the anterior cavernous and proximal supraclinoid segments. There is a mild calcified plaque without significant stenosis. The proximal left intracranial ICA is occluded with reconstitution of the supraclinoid segment by the posterior communicating artery. ACAs and MCAs are patent without evidence of significant stenosis or major branch vessel occlusion. No intracranial aneurysm is identified. Posterior circulation: Intracranial vertebral arteries are patent to the basilar with the right being dominant. PICA origins, right AICA origin, and bilateral SCA origins are patent. Basilar artery is patent without stenosis. There is a small left posterior communicating artery. PCAs are unremarkable. Venous sinuses: Patent. Anatomic variants: None. Delayed phase: No abnormal enhancement. IMPRESSION: 1. Occlusion of the distal left common carotid artery/carotid bifurcation. 2. Reconstitution of the left ICA intracranially at the supraclinoid level. 3. No major intracranial arterial occlusion or significant intracranial stenosis. 4. Widely patent vertebral arteries and right cervical carotid arteries. 5. Partially visualized opacities in both lung apices which may reflect pulmonary edema as described on yesterday's chest radiographs. Small right pleural effusion. These results will be called to the ordering clinician or representative by the Radiologist Assistant, and communication documented in the PACS or zVision Dashboard. Electronically Signed   By: Sebastian Ache M.D.   On: 02/18/2015 16:38   US Carotid Bilateral  02/18/2015  CLINICAL DATA:  Right-sided weakness this morning. EXAM: BILATERAL CAROTID DUPLEX ULTRASOUND  TECHNIQUE: Wallace Cullens scale imaging, color Doppler and duplex ultrasound were performed of bilateral carotid and vertebral arteries in the neck. COMPARISON:  Head CT today.  Cervical spine CT 12/27/2014 FINDINGS: Criteria: Quantification of carotid stenosis is based on velocity parameters that correlate the  residual internal carotid diameter with NASCET-based stenosis levels, using the diameter of the distal internal carotid lumen as the denominator for stenosis measurement. The following velocity measurements were obtained: RIGHT: ICA:  81 cm/sec CCA:  69 cm/sec SYSTOLIC ICA/CCA RATIO:  1.2 DIASTOLIC ICA/CCA RATIO:  1.5 ECA:  93 cm/sec LEFT ICA:  24 cm/sec CCA:  28 cm/sec SYSTOLIC ICA/CCA RATIO:  0.9 DIASTOLIC ICA/CCA RATIO:  1.4 ECA:  48 cm/sec RIGHT CAROTID ARTERY: Mild calcified plaque at the carotid bulb and proximal internal carotid artery. Normal Doppler waveforms. RIGHT VERTEBRAL ARTERY:  Normal antegrade flow and normal waveform. LEFT CAROTID ARTERY: Low amplitude high resistance waveform within the common carotid artery. Mild calcified plaque at the carotid bulb and proximal internal carotid artery. Echogenic material on grayscale images throughout the visualized internal carotid artery with very slow flow detected and "thud" flow waveform indicating near complete occlusion versus complete occlusion. LEFT VERTEBRAL ARTERY:  Normal antegrade flow with normal waveform. IMPRESSION: Mild calcified atherosclerotic plaque at the carotid bulbs and proximal internal carotid arteries bilaterally. Findings compatible with complete versus near complete occlusion of the left internal carotid artery. CTA may be helpful to make the distinction between near near complete occlusion versus complete occlusion. No hemodynamically significant stenosis of the right internal carotid artery. Normal flow within the vertebral arteries. These results were called by telephone at the time of interpretation on 02/18/2015 at 2:50 pm to Dr. Alford Highland , who verbally acknowledged these results. Electronically Signed   By: Elberta Fortis M.D.   On: 02/18/2015 14:52    Scheduled Meds: . aspirin EC  81 mg Oral Daily  . atorvastatin  40 mg Oral q1800  . budesonide (PULMICORT) nebulizer solution  0.25 mg Nebulization BID  . furosemide   20 mg Oral Daily  . Influenza vac split quadrivalent PF  0.5 mL Intramuscular Tomorrow-1000  . ipratropium-albuterol  3 mL Nebulization Q4H  . ketorolac  15 mg Intravenous Once  . levofloxacin (LEVAQUIN) IV  250 mg Intravenous Q24H  . methylPREDNISolone (SOLU-MEDROL) injection  40 mg Intravenous Q8H  . naproxen  375 mg Oral BID WC    Assessment/Plan:  1. Right-sided weakness. As per neurology, possible cervical stenosis. Awaiting MRI. IV Solu-Medrol given for the lungs will also help this. Appreciate physical therapy consultation. 2. Left carotid stenosis. No stroke seen on CT angiogram. As per neurology, no need for heparin and anticoagulation. Continue aspirin. 3. Acute bronchitis with asthmatic component- Rocephin and Zithromax. Continue low-dose IV Medrol and budesonide nebulizers 4. Acute diastolic congestive heart failure with moderate mitral valve regurgitation. Restart oral Lasix low-dose. No beta blocker with bronchospasm. 5. Thrombocytopenia- patient's platelet count has been variable over the years. Continue to monitor  Code Status:     Code Status Orders        Start     Ordered   02/17/15 1545  Full code   Continuous     02/17/15 1544     Family Communication: Son on the phone Disposition Plan: Potential rehabilitation  Consultants:  Neurology  Vascular surgery  Antibiotics: Rocephin and Zithromax  Time spent: 20 minutes  Alford Highland  [facility]

## 2015-02-20 NOTE — Progress Notes (Signed)
ANTIBIOTIC CONSULT NOTE -FOLLOW UP  Pharmacy Consult for Levaquin Indication: pneumonia  Allergies  Allergen Reactions  . Codeine Nausea And Vomiting    Patient Measurements: Height: 5\' 1"  (154.9 cm) Weight: 136 lb 8 oz (61.916 kg) IBW/kg (Calculated) : 47.8 Adjusted Body Weight:   Vital Signs: Temp: 97.9 F (36.6 C) (11/29 0908) Temp Source: Oral (11/29 0908) BP: 126/64 mmHg (11/29 0908) Pulse Rate: 99 (11/29 0908) Intake/Output from previous day: 11/28 0701 - 11/29 0700 In: 181.3 [I.V.:131.3; IV Piggyback:50] Out: 1250 [Urine:1250] Intake/Output from this shift:    Labs:  Recent Labs  02/17/15 1048 02/18/15 0503 02/19/15 0135 02/20/15 0435  WBC 3.8 6.3 4.2 10.7  HGB 13.3 12.4 13.5 14.0  PLT 137* 121* 153 196  CREATININE 0.73 0.64  --   --    Estimated Creatinine Clearance: 40.2 mL/min (by C-G formula based on Cr of 0.64). No results for input(s): VANCOTROUGH, VANCOPEAK, VANCORANDOM, GENTTROUGH, GENTPEAK, GENTRANDOM, TOBRATROUGH, TOBRAPEAK, TOBRARND, AMIKACINPEAK, AMIKACINTROU, AMIKACIN in the last 72 hours.   Microbiology: Recent Results (from the past 720 hour(s))  Urine culture     Status: None   Collection Time: 02/17/15 10:48 AM  Result Value Ref Range Status   Specimen Description URINE, RANDOM  Final   Special Requests NONE  Final   Culture MULTIPLE SPECIES PRESENT, SUGGEST RECOLLECTION  Final   Report Status 02/18/2015 FINAL  Final    Medical History: Past Medical History  Diagnosis Date  . Anemia     Medications:  No prescriptions prior to admission   Assessment: CrCl = 40.2 ml/min   Goal of Therapy:  resolution of infection  Plan:  Expected duration 7 days with resolution of temperature and/or normalization of WBC   Will continue  levaquin 250 mg IV Q24H   Nadalyn Deringer D 02/20/2015,9:32 AM

## 2015-02-20 NOTE — Progress Notes (Signed)
Physical Therapy Treatment Patient Details Name: Savannah Friedman MRN: [id number] DOB: Sep 07, 1923 Today's Date: 02/20/2015    History of Present Illness Pt [facility] to [facility] with complaints of SOB. She was initially admitted with acute respiratory failure related to acute bronchitis with asthmatic component as well as acute diastolic CHF. Pt subsequently developed RUE weakness although the timing of onset is somewaht unclear. Pt reports that immediately prior to arrival she "slid off" the end of her bed and was on the floor for multiple hours until she could call for emergency services. She sufferred one other fall recently which occurred in October. She was taken via EMS to the hospital but was not found to have any acute bony injuries. Pt denies other falls in the last 12 months. Pt reports that prior to current admission she was living alone and independent with ADLs/IADLs.     PT Comments    Pt demonstrates slightly improving RUE strength with MMT. She reports increased R hip pain today which limits bed mobility, transfers, and ambulation. Pt still with very poor balance in standing and limited capacity for ambulation due to safety. Unclear at this time if patient would be able to fully tolerate inpatient rehab. MRI is still pending to assess for CVA. Pt will benefit from skilled PT services to address deficits in strength, balance, and mobility in order to return to full function at home.   Follow Up Recommendations  CIR;SNF (Unsure currently if patient could tolerate CIR, R hip pain)     Equipment Recommendations  Rolling walker with 5" wheels (TBD at SNF/CIR)    Recommendations for Other Services Rehab consult     Precautions / Restrictions Precautions Precautions: Fall Restrictions Weight Bearing Restrictions: No    Mobility  Bed Mobility Overal bed mobility: Needs Assistance Bed Mobility: Supine to Sit;Sit to Supine     Supine to sit: Min assist Sit to supine: Mod assist    General bed mobility comments: Pt requires assist for pushing up from L sidelying position to sitting. She also requires assist for RLE adduction/abduction during supine to sit and sit to supine. Pt with poor sequencing and increased reports of R hip pain. Assist for scooting to EOB  Transfers Overall transfer level: Needs assistance Equipment used: Rolling walker (2 wheeled) Transfers: Sit to/from Stand Sit to Stand: Mod assist         General transfer comment: Decreased weight shift to RLE due to pain. Pt with poor balance with posterior leaning and poor anterior weight shifting during transfer. Poor sequencing and poor self correction for instability.  Ambulation/Gait Ambulation/Gait assistance: Mod assist Ambulation Distance (Feet): 6 Feet Assistive device: Rolling walker (2 wheeled) Gait Pattern/deviations: Step-to pattern Gait velocity: Decreased Gait velocity interpretation: <1.8 ft/sec, indicative of risk for recurrent falls General Gait Details: Pt requires considerable cues for proper sequencing with walker. Pt continues with posterior LOB during ambulation requiring therapist assist to prevent falling. Pt requires cues for proper stepping and advancing walker. Pt reports increase in R hip pain with ambulation due to chronic bursitis. Refuses further ambulation at this time   Information systems managertairs            Wheelchair Mobility    Modified Rankin (Stroke Patients Only)       Balance Overall balance assessment: Needs assistance Sitting-balance support: Feet supported       Standing balance support: Bilateral upper extremity supported Standing balance-Leahy Scale: Poor  Cognition Arousal/Alertness: Awake/alert Behavior During Therapy: WFL for tasks assessed/performed Overall Cognitive Status: Within Functional Limits for tasks assessed                      Exercises General Exercises - Lower Extremity Ankle Circles/Pumps:  Strengthening;Both;15 reps;Supine Quad Sets: Strengthening;Both;15 reps;Supine Gluteal Sets: Strengthening;Both;15 reps;Supine Short Arc Quad: Strengthening;Both;10 reps;Supine Long Arc Quad: Strengthening;Both;10 reps;Seated Heel Slides: Strengthening;Both;10 reps;Supine Hip ABduction/ADduction: Strengthening;Both;10 reps;Supine Straight Leg Raises: Strengthening;Both;10 reps;Supine Hip Flexion/Marching: Strengthening;Both;10 reps;Seated    General Comments        Pertinent Vitals/Pain Pain Assessment: 0-10 Pain Score: 5  Pain Location: R hip, chronic Pain Intervention(s): Premedicated before session;Monitored during session    Home Living                      Prior Function            PT Goals (current goals can now be found in the care plan section) Acute Rehab PT Goals Patient Stated Goal: I want to get back doing everything. PT Goal Formulation: With patient/family Time For Goal Achievement: 03/05/15 Potential to Achieve Goals: Good Progress towards PT goals: Progressing toward goals    Frequency  7X/week    PT Plan Current plan remains appropriate    Co-evaluation             End of Session Equipment Utilized During Treatment: Gait belt Activity Tolerance: Patient tolerated treatment well Patient left: with call bell/phone within reach;in bed;with bed alarm set;with SCD's reapplied (MD in room. Refuses up to chair currently)     Time: 1191-4782 PT Time Calculation (min) (ACUTE ONLY): 24 min  Charges:  $Therapeutic Exercise: 8-22 mins $Therapeutic Activity: 8-22 mins                    G Codes:      Sharalyn Ink Chris Cripps PT, DPT   Chyan Carnero 02/20/2015, 12:13 PM

## 2015-02-20 NOTE — Progress Notes (Signed)
ANTICOAGULATION CONSULT NOTE - Initial Consult  Pharmacy Consult for warfarin Indication: CVA with mitral valve replacement   Allergies  Allergen Reactions  . Codeine Nausea And Vomiting    Patient Measurements: Height: 5\' 1"  (154.9 cm) Weight: 136 lb 8 oz (61.916 kg) IBW/kg (Calculated) : 47.8 Heparin Dosing Weight:   Vital Signs: Temp: 97.5 F (36.4 C) (11/28 2050) Temp Source: Oral (11/28 2050) BP: 127/55 mmHg (11/28 2050) Pulse Rate: 87 (11/28 2050)  Labs:  Recent Labs  02/17/15 1048 02/18/15 0503 02/19/15 0135 02/19/15 0957 02/19/15 1535 02/20/15 0435  HGB 13.3 12.4 13.5  --   --  14.0  HCT 39.7 36.8 40.8  --   --  40.6  PLT 137* 121* 153  --   --  196  LABPROT  --   --   --   --  12.8 13.4  INR  --   --   --   --  0.94 1.00  HEPARINUNFRC  --   --  0.56 0.51  --  0.59  CREATININE 0.73 0.64  --   --   --   --   TROPONINI <0.03  --   --   --   --   --     Estimated Creatinine Clearance: 40.2 mL/min (by C-G formula based on Cr of 0.64).   Medical History: Past Medical History  Diagnosis Date  . Anemia     Medications:  No prescriptions prior to admission   Scheduled:  . aspirin EC  81 mg Oral Daily  . atorvastatin  40 mg Oral q1800  . budesonide (PULMICORT) nebulizer solution  0.25 mg Nebulization BID  . Influenza vac split quadrivalent PF  0.5 mL Intramuscular Tomorrow-1000  . ipratropium-albuterol  3 mL Nebulization Q4H  . ketorolac  15 mg Intravenous Once  . levofloxacin (LEVAQUIN) IV  250 mg Intravenous Q24H  . methylPREDNISolone (SOLU-MEDROL) injection  40 mg Intravenous Q8H  . naproxen  375 mg Oral BID WC  . warfarin  5 mg Oral q1800  . Warfarin - Pharmacist Dosing Inpatient   Does not apply q1800    Assessment: Patient being treated with heparin gtt and now being initiated on warfarin as well for carotid artery stenosis and mitral valve replacement Goal of Therapy:  INR 2.5-3.5  Monitor platelets by anticoagulation protocol: Yes    Plan:  MD would like to start warfarin 5 mg PO q1800 due to patient's size. Will continue with this order; however will evaluate for appropriateness of dose upon INR results.   11/29 AM INR 1.0. Continue current regimen and recheck INR in AM.  Vinicius Brockman S 02/20/2015,5:58 AM

## 2015-02-20 NOTE — Progress Notes (Signed)
NEUROLOGY  S:  Pain a lot better after toradol, no right sided weakness today  ROS neg except for R hip pain  O:  97.9   126*64   99   18 NAD, nl weight Normocephalic, oropharynx clear Supple, no LAD CTA B, no wheezing RRR, no murmur No C/C/E  A+Ox4, nl speech and language PERRLA, EOMI, face symmetric 5/5 B  A/P: 1.  Probable cervical myelopathy-  Improved on NSAIDs 2.  L carotid occlusion-  asymptomatic -  MRI today -  Continue NSAIDs -  No need for anticoagulation for L occlusion -  Start ASA 81mg  daily -  Will follow

## 2015-02-21 ENCOUNTER — Inpatient Hospital Stay: Payer: Medicare Other

## 2015-02-21 LAB — CBC
HCT: 36.2 % (ref 35.0–47.0)
Hemoglobin: 12.6 g/dL (ref 12.0–16.0)
MCH: 31.1 pg (ref 26.0–34.0)
MCHC: 34.8 g/dL (ref 32.0–36.0)
MCV: 89.5 fL (ref 80.0–100.0)
PLATELETS: 196 10*3/uL (ref 150–440)
RBC: 4.04 MIL/uL (ref 3.80–5.20)
RDW: 13.3 % (ref 11.5–14.5)
WBC: 10.5 10*3/uL (ref 3.6–11.0)

## 2015-02-21 LAB — PROTIME-INR
INR: 1.47
PROTHROMBIN TIME: 17.9 s — AB (ref 11.4–15.0)

## 2015-02-21 MED ORDER — LEVOFLOXACIN IN D5W 250 MG/50ML IV SOLN
250.0000 mg | INTRAVENOUS | Status: DC
  Administered 2015-02-22: 250 mg via INTRAVENOUS
  Filled 2015-02-21 (×2): qty 50

## 2015-02-21 MED ORDER — LEVOFLOXACIN 250 MG PO TABS: 250.0000 mg | ORAL_TABLET | Freq: Every day | ORAL | Status: DC

## 2015-02-21 NOTE — NC FL2 (Signed)
MEDICAID FL2 LEVEL OF CARE SCREENING TOOL     IDENTIFICATION  Patient Name: Savannah Friedman Birthdate: 29-Sep-1923 Sex: female Admission Date (Current Location): 02/17/2015  [facility] [id number]   Facility and Address:  [facility], [address], [place], [place] [address]      Provider Number: [id number]  Attending Physician Name and Address:  Alford Highlandichard Wieting, MD  Relative Name and Phone Number:       Current Level of Care: Hospital Recommended Level of Care: Skilled Nursing Facility Prior Approval Number:    Date Approved/Denied:   PASRR Number:    Discharge Plan: SNF    Current Diagnoses: Patient Active Problem List   Diagnosis Date Noted  . Pneumonia 02/17/2015    Orientation ACTIVITIES/SOCIAL BLADDER RESPIRATION    Self, Place, Situation  Active Continent Normal  BEHAVIORAL SYMPTOMS/MOOD NEUROLOGICAL BOWEL NUTRITION STATUS   (none)  (none) Continent Diet  PHYSICIAN VISITS COMMUNICATION OF NEEDS Height & Weight Skin  30 days Verbally   136 lbs. Normal          AMBULATORY STATUS RESPIRATION    Supervision limited Normal      Personal Care Assistance Level of Assistance  Bathing, Dressing Bathing Assistance: Limited assistance   Dressing Assistance: Limited assistance      Functional Limitations Info                SPECIAL CARE FACTORS FREQUENCY  PT (By licensed PT), OT (By licensed OT)                   Additional Factors Info  Code Status, Allergies Code Status Info: Full Allergies Info: Codeine           Current Medications (02/21/2015):  This is the current hospital active medication list Current Facility-Administered Medications  Medication Dose Route Frequency Provider Last Rate Last Dose  . acetaminophen (TYLENOL) tablet 650 mg  650 mg Oral Q6H PRN Adrian SaranSital Mody, MD   650 mg at 02/20/15 0942   Or  . acetaminophen (TYLENOL) suppository 650 mg  650 mg Rectal Q6H PRN  Adrian SaranSital Mody, MD      . alum & mag hydroxide-simeth (MAALOX/MYLANTA) 200-200-20 MG/5ML suspension 30 mL  30 mL Oral Q6H PRN Adrian SaranSital Mody, MD      . aspirin EC tablet 81 mg  81 mg Oral Daily Alford Highlandichard Wieting, MD   81 mg at 02/21/15 0935  . atorvastatin (LIPITOR) tablet 40 mg  40 mg Oral q1800 Alford Highlandichard Wieting, MD   40 mg at 02/19/15 1645  . bisacodyl (DULCOLAX) EC tablet 5 mg  5 mg Oral Daily PRN Adrian SaranSital Mody, MD   5 mg at 02/20/15 1321  . budesonide (PULMICORT) nebulizer solution 0.25 mg  0.25 mg Nebulization BID Alford Highlandichard Wieting, MD   0.25 mg at 02/21/15 [id number]  . furosemide (LASIX) tablet 20 mg  20 mg Oral Daily Alford Highlandichard Wieting, MD   20 mg at 02/21/15 0935  . HYDROcodone-acetaminophen (NORCO/VICODIN) 5-325 MG per tablet 1-2 tablet  1-2 tablet Oral Q4H PRN Adrian SaranSital Mody, MD   2 tablet at 02/20/15 1539  . Influenza vac split quadrivalent PF (FLUARIX) injection 0.5 mL  0.5 mL Intramuscular Tomorrow-1000 Alford Highlandichard Wieting, MD      . ipratropium-albuterol (DUONEB) 0.5-2.5 (3) MG/3ML nebulizer solution 3 mL  3 mL Nebulization TID Adrian SaranSital Mody, MD   3 mL at 02/21/15 0735  . ketorolac (TORADOL) 15 MG/ML injection 15 mg  15 mg Intravenous Once Molli HazardMatthew  Katrinka Blazing, MD   15 mg at 02/19/15 1700  . Levofloxacin (LEVAQUIN) IVPB 250 mg  250 mg Intravenous Q24H Adrian Saran, MD   250 mg at 02/21/15 0935  . [START ON 02/22/2015] Levofloxacin (LEVAQUIN) IVPB 250 mg  250 mg Intravenous Q24H Sital Mody, MD      . methylPREDNISolone sodium succinate (SOLU-MEDROL) 40 mg/mL injection 40 mg  40 mg Intravenous Q8H Alford Highland, MD   40 mg at 02/21/15 0432  . naproxen (NAPROSYN) tablet 375 mg  375 mg Oral BID WC Mellody Drown, MD   375 mg at 02/21/15 0806  . ondansetron (ZOFRAN) tablet 4 mg  4 mg Oral Q6H PRN Adrian Saran, MD       Or  . ondansetron (ZOFRAN) injection 4 mg  4 mg Intravenous Q6H PRN Adrian Saran, MD   4 mg at 02/20/15 1721  . senna-docusate (Senokot-S) tablet 1 tablet  1 tablet Oral QHS PRN Adrian Saran, MD         Discharge  Medications: Please see discharge summary for a list of discharge medications.  Relevant Imaging Results:  Relevant Lab Results:  Recent Labs    Additional Information SS: [id number]  York Spaniel, LCSW

## 2015-02-21 NOTE — Progress Notes (Signed)
NEUROLOGY  S: Pain controlled  ROS neg except for R hip pain  O: 98.1    131/74    108   20 NAD, nl weight Normocephalic, oropharynx clear Supple, no LAD CTA B, no wheezing RRR, no murmur No C/C/E  A+Ox4, nl speech and language PERRLA, EOMI, face symmetric 5/5 B, mild resting tremor, slight bradykinesia  A/P: 1. L MCA small infarcts-  These are likely flow related as there is no way embolic disease can travel through L occlusion 2. L carotid occlusion- asymptomatic 3.  Mild Parkinsonism-  This could be cause of falls and should be watched - continue ASA 81mg  and statin -  Needs therapy -  Will sign off, please call with questions -  [facility] in 4-6 weeks

## 2015-02-21 NOTE — Clinical Social Work Note (Signed)
[facility] and patient accepts as this is her preference. York SpanielMonica Estle Sabella MSW,LCSW [phone]

## 2015-02-21 NOTE — Progress Notes (Signed)
Notified by RN CM that pt prefers SNF rather than assessment for [facility] [place]. [phone]

## 2015-02-21 NOTE — Progress Notes (Signed)
A/O, forgetful at times.  VSS.  No complaints of pain today.  Tolerating small amounts of her diet, she just doesn't fee like eating.  Voiding adequately.  No BM this shift.  Patient got up and walked to the BR with PT, but her heart rate did jump up when she was getting back up to go to bed.  I gave her some stroke education, but she does need reinforcement.  No significant changes.

## 2015-02-21 NOTE — Care Management Important Message (Signed)
Important Message  Patient Details  Name: Savannah Friedman MRN: [id number] Date of Birth: 1923/04/20   Medicare Important Message Given:  Yes    Olegario MessierKathy A Tomeeka Plaugher 02/21/2015, 11:18 AM

## 2015-02-21 NOTE — Progress Notes (Signed)
Physical Therapy Treatment Patient Details Name: Savannah Friedman Frasier MRN: [id number] DOB: [birth year] Today's Date: 02/21/2015    History of Present Illness [facility] with [facility] of SOB. She was initially admitted with acute respiratory failure related to acute bronchitis with asthmatic component as well as acute diastolic CHF. Pt subsequently developed RUE weakness although the timing of onset is somewaht unclear. Pt reports that immediately prior to arrival she "slid off" the end of her bed and was on the floor for multiple hours until she could call for emergency services. She sufferred one other fall recently which occurred in October. She was taken via EMS to the hospital but was not found to have any acute bony injuries. Pt denies other falls in the last 12 months. Pt reports that prior to current admission she was living alone and independent with ADLs/IADLs.     PT Comments    Pt agreeable to PT and notes feeling better this morning until ambulating to the bathroom. Pt noted to have increased heart rate to 138 and symptomatic of dizziness, overheated and general malaise. Pt able to ambulate back to bed with heart rate decreased after several minutes to 110 beats per minute. Nursing notified. Pt does demonstrate decreased need for assist with all mobility today and improved balance. Pt educated on proper use of rolling walker, as pt holds rolling walker too far out in front with poor correction with cues. Continue PT for progression of strength, endurance, ambulation and safety to improve functional mobility.   Follow Up Recommendations  CIR;SNF     Equipment Recommendations  Rolling walker with 5" wheels    Recommendations for Other Services       Precautions / Restrictions Precautions Precautions: Fall Restrictions Weight Bearing Restrictions: No    Mobility  Bed Mobility Overal bed mobility: Needs Assistance Bed Mobility: Supine to Sit;Sit to Supine     Supine to  sit: Min guard (use of rails and increased time/effort) Sit to supine: Min assist   General bed mobility comments: Less assist required today; does take considerable increased time to sit and to scoot to edge of bed  Transfers Overall transfer level: Needs assistance Equipment used: Rolling walker (2 wheeled) Transfers: Sit to/from Stand Sit to Stand: Min assist (cues for hands from bed and commode)         General transfer comment: Improved tolerance of weightbearing on RLE although continues with discomfort. Improved balance; no retropulsion noted  Ambulation/Gait Ambulation/Gait assistance: Min guard Ambulation Distance (Feet): 24 Feet (performed 2x) Assistive device: Rolling walker (2 wheeled) Gait Pattern/deviations: Step-through pattern;Trunk flexed Gait velocity: Decreased Gait velocity interpretation: <1.8 ft/sec, indicative of risk for recurrent falls General Gait Details: slow; requires cueing for proper placement of rw, as pt maintains too far in front of her. Poor to no correction with cues. Heart rate increased to 138 with ambulation with overheated, dizzy and general malaise symptoms   Stairs            Wheelchair Mobility    Modified Rankin (Stroke Patients Only)       Balance Overall balance assessment: Needs assistance Sitting-balance support: Feet supported;Bilateral upper extremity supported Sitting balance-Leahy Scale: Good     Standing balance support: Bilateral upper extremity supported Standing balance-Leahy Scale: Fair                      Cognition Arousal/Alertness: Awake/alert Behavior During Therapy: WFL for tasks assessed/performed Overall Cognitive Status: Within Functional Limits for  tasks assessed                      Exercises General Exercises - Lower Extremity Ankle Circles/Pumps: AROM;Both;20 reps;Supine Quad Sets: Strengthening;Both;20 reps;Supine Gluteal Sets: Strengthening;Both;20 reps;Supine Long Arc  Quad: AROM;Both;10 reps;Seated Heel Slides: AROM;Both;10 reps;Supine Hip ABduction/ADduction:  (deferred due to R hip pain) Hip Flexion/Marching: AROM;Both;20 reps;Seated Other Exercises Other Exercises: adductor squeeze 20x supine Other Exercises: Educated on using pain as a guide with exercises and to avoid repetive movements that aggravate R hip due to bursitis. Education on rest and ice for bursitis    General Comments        Pertinent Vitals/Pain Pain Assessment: 0-10 Pain Score: 3  Pain Location: R hip pain/generalized body aches Pain Intervention(s): Monitored during session    Home Living                      Prior Function            PT Goals (current goals can now be found in the care plan section) Progress towards PT goals: Progressing toward goals    Frequency  7X/week    PT Plan Current plan remains appropriate    Co-evaluation             End of Session Equipment Utilized During Treatment: Gait belt Activity Tolerance: Patient tolerated treatment well Patient left: in bed;with call bell/phone within reach;with bed alarm set;with nursing/sitter in room     Time: 1324-4010 PT Time Calculation (min) (ACUTE ONLY): 36 min  Charges:  $Gait Training: 8-22 mins $Therapeutic Exercise: 8-22 mins                    G Codes:      Kristeen Miss 02/21/2015, 11:07 AM

## 2015-02-21 NOTE — Progress Notes (Signed)
Patient ID: Savannah Friedman, female   DOB: [birth year], 81 y.o.   MRN: [id number] [facility] PROGRESS NOTE  Savannah Friedman:[id number] DOB: 1923-12-02 DOA: 02/17/2015 PCP: Lynnea Ferrier, MD  HPI/Subjective: Patient thought she was doing okay but they had to put back the oxygen. She is still not feeling her breathing is quite right. Still with some cough and shortness of breath. Right side a little bit weak.  Objective: Filed Vitals:   02/21/15 0959 02/21/15 1254  BP:  141/80  Pulse: 108 94  Temp:  97.5 F (36.4 C)  Resp:  20    Filed Weights   02/17/15 1045  Weight: 61.916 kg (136 lb 8 oz)    ROS: Review of Systems  Constitutional: Positive for malaise/fatigue. Negative for fever and chills.  Eyes: Negative for blurred vision.  Respiratory: Positive for cough, shortness of breath and wheezing.   Cardiovascular: Negative for chest pain.  Gastrointestinal: Negative for nausea, vomiting, abdominal pain, diarrhea and constipation.  Genitourinary: Negative for dysuria.  Musculoskeletal: Negative for joint pain.  Neurological: Positive for tremors, focal weakness and weakness. Negative for dizziness and headaches.   Exam: Physical Exam  Constitutional: She is oriented to person, place, and time.  HENT:  Nose: No mucosal edema.  Mouth/Throat: No oropharyngeal exudate or posterior oropharyngeal edema.  Eyes: Conjunctivae, EOM and lids are normal. Pupils are equal, round, and reactive to light.  Neck: No JVD present. Carotid bruit is not present. No edema present. No thyroid mass and no thyromegaly present.  Cardiovascular: S1 normal and S2 normal.  Exam reveals no gallop.   No murmur heard. Pulses:      Dorsalis pedis pulses are 2+ on the right side, and 2+ on the left side.  Respiratory: No respiratory distress. She has decreased breath sounds in the right lower field and the left lower field. She has no wheezes. She has no rhonchi. She has rales in the right  lower field and the left lower field.  GI: Soft. Bowel sounds are normal. There is no tenderness.  Musculoskeletal:       Right ankle: She exhibits no swelling.       Left ankle: She exhibits no swelling.  Lymphadenopathy:    She has no cervical adenopathy.  Neurological: She is alert and oriented to person, place, and time. No cranial nerve deficit.  Right hand coordination will better today. Power 5 out of 5 right upper extremity. Difficulty with right leg straight leg raise secondary to hip bursitis.  Skin: Skin is warm. No rash noted. Nails show no clubbing.  Psychiatric: She has a normal mood and affect.    Data Reviewed: Basic Metabolic Panel:  Recent Labs Lab 02/17/15 1048 02/18/15 0503  NA 135 139  K 3.8 3.3*  CL 100* 104  CO2 26 27  GLUCOSE 110* 137*  BUN 18 15  CREATININE 0.73 0.64  CALCIUM 8.4* 8.0*  MG 1.8  --   PHOS 3.0  --    Liver Function Tests:  Recent Labs Lab 02/17/15 1048  AST 22  ALT 10*  ALKPHOS 58  BILITOT 1.0  PROT 6.5  ALBUMIN 4.0    Recent Labs Lab 02/17/15 1048  LIPASE 31   CBC:  Recent Labs Lab 02/17/15 1048 02/18/15 0503 02/19/15 0135 02/20/15 0435 02/21/15 0515  WBC 3.8 6.3 4.2 10.7 10.5  NEUTROABS 3.1  --   --   --   --   HGB 13.3 12.4 13.5 14.0  12.6  HCT 39.7 36.8 40.8 40.6 36.2  MCV 93.6 91.9 93.2 91.5 89.5  PLT 137* 121* 153 196 196     Studies: Dg Knee 1-2 Views Left  02/19/2015  CLINICAL DATA:  Pre MRI clearance EXAM: LEFT KNEE - 1-2 VIEW; RIGHT KNEE - 1-2 VIEW COMPARISON:  None. FINDINGS: Right knee: The bones are adequately mineralized. There is narrowing of the medial joint compartment to a moderate degree with milder narrowing laterally. There is no acute fracture nor dislocation. There is no joint effusion. No metallic foreign bodies are present. Left knee: There is a 6 cm long metallic pin through a longitudinally oriented through the medial third of the left patella for fixation of previous fracture. The  bones are adequately mineralized. There is mild narrowing of the medial and lateral joint compartments. There is no joint effusion. There is no acute bony abnormality. IMPRESSION: 1. There is a 6 cm metallic pin that projects through the medial third of the body of the patella likely from previous fracture fixation. The MRI compatibility of this pin will merit investigation prior to MRI. The patient is not cleared for MRI. 2. The right knee exhibits no metallic foreign bodies. 3. Both knees exhibit mild to moderate osteoarthritic narrowing of the medial and lateral joint compartments. Electronically Signed   By: David  Swazil[place]Jordan M.D.   On: 02/19/2015 17:19   Dg Knee 1-2 Views Right  02/19/2015  CLINICAL DATA:  Pre MRI clearance EXAM: LEFT KNEE - 1-2 VIEW; RIGHT KNEE - 1-2 VIEW COMPARISON:  None. FINDINGS: Right knee: The bones are adequately mineralized. There is narrowing of the medial joint compartment to a moderate degree with milder narrowing laterally. There is no acute fracture nor dislocation. There is no joint effusion. No metallic foreign bodies are present. Left knee: There is a 6 cm long metallic pin through a longitudinally oriented through the medial third of the left patella for fixation of previous fracture. The bones are adequately mineralized. There is mild narrowing of the medial and lateral joint compartments. There is no joint effusion. There is no acute bony abnormality. IMPRESSION: 1. There is a 6 cm metallic pin that projects through the medial third of the body of the patella likely from previous fracture fixation. The MRI compatibility of this pin will merit investigation prior to MRI. The patient is not cleared for MRI. 2. The right knee exhibits no metallic foreign bodies. 3. Both knees exhibit mild to moderate osteoarthritic narrowing of the medial and lateral joint compartments. Electronically Signed   By: David  [place]il[place]Jordan M.D.   On: 02/19/2015 17:19   Mr Brain Wo Contrast  02/20/2015   CLINICAL DATA:  Right-sided weakness. Left distal common carotid artery/carotid bifurcation occlusion on recent CTA. EXAM: MRI HEAD WITHOUT CONTRAST TECHNIQUE: Multiplanar, multiecho pulse sequences of the brain and surrounding structures were obtained without intravenous contrast. COMPARISON:  Head and neck CTA 02/18/2015 FINDINGS: There is an 8 mm focus of restricted diffusion in the region of the inferior aspect of the posterior limb of the internal capsule/cerebral peduncle on the left. There is also a 5 mm acute cortical infarct in the left parietal lobe. There are a few scattered foci of chronic microhemorrhage in the right greater than left cerebral hemispheres as well as one in the left cerebellum. There is moderate cerebral atrophy. Patchy T2 hyperintensities in the subcortical and deep cerebral white matter bilaterally are nonspecific but compatible with moderate chronic small vessel ischemic disease. Small, chronic infarcts are noted  in the right cerebellum. Prior bilateral cataract extraction is noted. Small volume fluid is present in the left frontal, bilateral sphenoid, and bilateral maxillary sinuses. There is partial opacification of multiple anterior ethmoid air cells bilaterally. Mastoid air cells are clear. Distal left internal carotid artery flow void is abnormal, corresponding to occlusion demonstrated on recent CTA. IMPRESSION: 1. Small acute infarcts in the inferior left internal capsule/cerebral peduncle and left parietal cortex. 2. Moderate chronic small vessel ischemic disease. 3. Chronic right cerebellar infarcts. 4. Paranasal sinus fluid and mucosal thickening, correlate for acute sinusitis. Electronically Signed   By: Sebastian Ache M.D.   On: 02/20/2015 15:15   Mr Cervical Spine Wo Contrast  02/21/2015  CLINICAL DATA:  Right side weakness. Possible cervical myelopathy. Initial encounter. EXAM: MRI CERVICAL SPINE WITHOUT CONTRAST TECHNIQUE: Multiplanar, multisequence MR imaging of the  cervical spine was performed. No intravenous contrast was administered. COMPARISON:  CT cervical spine 12/27/2014. FINDINGS: Facet arthropathy results in approximately 0.2 cm anterolisthesis C3 on C4 and C7 on T1. Trace retrolisthesis C4 on C5 also identified. Vertebral body height and signal are maintained. The C5-6 level is fused. The craniocervical junction is normal. Cervical cord signal is normal. Imaged paraspinous structures are unremarkable. C2-3: Facet arthropathy and a shallow disc bulge without central canal or foraminal stenosis. C3-4: The disc is slightly uncovered and there is facet arthropathy, worse on the left. The central canal and foramina remain open. C4-5: Small disc osteophyte complex and uncovertebral disease are seen. There is left worse than right facet degenerative disease. There is left worse than right facet arthropathy. The central canal is mildly narrowed. Moderate left foraminal narrowing is identified. The right foramen is open. C5-6: Posterior bony ridging is present. The central canal and foramina are open. C6-7: Left worse than right facet degenerative disease and a shallow disc bulge are seen. The central canal and foramina are open. C7-T1: The disc is slightly uncovered and there is facet degenerative change. The central canal and foramina are open. IMPRESSION: Overall mild spondylosis of the cervical spine most notable at C4-5 as described above. Electronically Signed   By: Drusilla Kanner M.D.   On: 02/21/2015 08:15    Scheduled Meds: . aspirin EC  81 mg Oral Daily  . atorvastatin  40 mg Oral q1800  . budesonide (PULMICORT) nebulizer solution  0.25 mg Nebulization BID  . furosemide  20 mg Oral Daily  . Influenza vac split quadrivalent PF  0.5 mL Intramuscular Tomorrow-1000  . ipratropium-albuterol  3 mL Nebulization TID  . ketorolac  15 mg Intravenous Once  . [START ON 02/22/2015] levofloxacin (LEVAQUIN) IV  250 mg Intravenous Q24H  . methylPREDNISolone  (SOLU-MEDROL) injection  40 mg Intravenous Q8H  . naproxen  375 mg Oral BID WC    Assessment/Plan:  1. Acute CVA's with Right-sided weakness. Patient on aspirin and statin. Appreciate physical therapy consultation,patient will go to rehabilitation soon. As per neurology the strokes are likely secondary to low flow. 2. Left carotid stenosis. No further treatment secondary to complete stenosis. 3. Acute bronchitis with asthmatic component- Levaquin. Continue low-dose IV Medrol and budesonide nebulizers 4. Acute diastolic congestive heart failure with moderate mitral valve regurgitation. Restart oral Lasix low-dose. No beta blocker with bronchospasm. 5. Thrombocytopenia- has come up with treatment of infection 6. Heart rate went fast when she was in the bathroom. Likely secondary to the bronchospasm. Continue to monitor.  Code Status:     Code Status Orders  Start     Ordered   02/17/15 1545  Full code   Continuous     02/17/15 1544     Family Communication: Son on the phone Disposition Plan: rehabilitation either Thursday or Friday  Consultants:  Neurology  Vascular surgery  Antibiotics: Levaquin  Time spent: 20 minutes  Alford Highland  [facility]

## 2015-02-21 NOTE — Clinical Social Work Note (Signed)
Clinical Social Work Assessment  Patient Details  Name: Savannah Friedman MRN: [id number] Date of Birth: [birth year]  Date of referral:                  Reason for consult:                   Permission sought to share information with:    Permission granted to share information::     Name::        Agency::     Relationship::     Contact Information:     Housing/Transportation Living arrangements for the past 2 months:    Source of Information:    Patient Interpreter Needed:    Criminal Activity/Legal Involvement Pertinent to Current Situation/Hospitalization:    Significant Relationships:    Lives with:    Do you feel safe going back to the place where you live?    Need for family participation in patient care:     Care giving concerns:  Patient lives alone   Office managerocial Worker assessment / plan:  Patient stated to CSW that she prefers to not go [facility] rehab at discharge as she does not feel she can tolerate 3 hours of PT. Patient informs CSW that she prefers to stay in [place] and prefers [place] if possible. Patient states that her son, Rosanne AshingJim, also stated it probably would be best for her to remain closer to home. Bedsearch intiated.  Employment status:    Insurance information:    PT Recommendations:    Information / Referral to community resources:     Patient/Family's Response to care:  Patient had decided that she preferred to do STR rather than [facility].  Patient/Family's Understanding of and Emotional Response to Diagnosis, Current Treatment, and Prognosis:  Patient was able to vocalize that she would have to tolerate 3 hours of therapy per day at CIR and appears to understand the difference in the different levels of care.  Emotional Assessment Appearance:    Attitude/Demeanor/Rapport:    Affect (typically observed):    Orientation:    Alcohol / Substance use:    Psych involvement (Current and /or in the community):     Discharge Needs  Concerns to be  addressed:    Readmission within the last 30 days:    Current discharge risk:    Barriers to Discharge:      York SpanielMonica Gurtej Noyola, LCSW 02/21/2015, 9:51 AM

## 2015-02-22 ENCOUNTER — Inpatient Hospital Stay: Payer: Medicare Other

## 2015-02-22 LAB — BASIC METABOLIC PANEL
Anion gap: 8 (ref 5–15)
BUN: 51 mg/dL — AB (ref 6–20)
CHLORIDE: 99 mmol/L — AB (ref 101–111)
CO2: 29 mmol/L (ref 22–32)
CREATININE: 0.92 mg/dL (ref 0.44–1.00)
Calcium: 8.3 mg/dL — ABNORMAL LOW (ref 8.9–10.3)
GFR calc Af Amer: >60 mL/min (ref >60)
GFR calc non Af Amer: 54 mL/min — ABNORMAL LOW (ref >60)
GLUCOSE: 169 mg/dL — AB (ref 65–99)
Potassium: 4.6 mmol/L (ref 3.5–5.1)
SODIUM: 136 mmol/L (ref 135–145)

## 2015-02-22 LAB — PROTIME-INR
INR: 1.44
Prothrombin Time: 17.6 seconds — ABNORMAL HIGH (ref 11.4–15.0)

## 2015-02-22 MED ORDER — POLYETHYLENE GLYCOL 3350 17 G PO PACK
17.0000 g | PACK | Freq: Every day | ORAL | Status: DC
  Administered 2015-02-22 – 2015-02-23 (×2): 17 g via ORAL
  Filled 2015-02-22 (×2): qty 1

## 2015-02-22 NOTE — Progress Notes (Signed)
Patient ID: Savannah Friedman, female   DOB: 1923-05-19, 81 y.o.   MRN: [id number] [facility] PROGRESS NOTE  Savannah Friedman:[id number] DOB: [birth year] DOA: 02/17/2015 PCP: Lynnea Ferrier, MD  HPI/Subjective: Patient feeling better. Still with cough and shortness of breath. Right-sided weakness is better.  Objective: Filed Vitals:   02/22/15 0528 02/22/15 1230  BP: 115/61 159/65  Pulse: 88 96  Temp: 98.1 F (36.7 C)   Resp: 16 18    Filed Weights   02/17/15 1045  Weight: 61.916 kg (136 lb 8 oz)    ROS: Review of Systems  Constitutional: Positive for malaise/fatigue. Negative for fever and chills.  Eyes: Negative for blurred vision.  Respiratory: Positive for cough, shortness of breath and wheezing.   Cardiovascular: Negative for chest pain.  Gastrointestinal: Negative for nausea, vomiting, abdominal pain, diarrhea and constipation.  Genitourinary: Negative for dysuria.  Musculoskeletal: Negative for joint pain.  Neurological: Positive for tremors, focal weakness and weakness. Negative for dizziness and headaches.   Exam: Physical Exam  Constitutional: She is oriented to person, place, and time.  HENT:  Nose: No mucosal edema.  Mouth/Throat: No oropharyngeal exudate or posterior oropharyngeal edema.  Eyes: Conjunctivae, EOM and lids are normal. Pupils are equal, round, and reactive to light.  Neck: No JVD present. Carotid bruit is not present. No edema present. No thyroid mass and no thyromegaly present.  Cardiovascular: S1 normal and S2 normal.  Exam reveals no gallop.   No murmur heard. Pulses:      Dorsalis pedis pulses are 2+ on the right side, and 2+ on the left side.  Respiratory: No respiratory distress. She has decreased breath sounds in the right middle field, the right lower field, the left middle field and the left lower field. She has wheezes in the right middle field, the right lower field, the left middle field and the left lower field. She  has no rhonchi. She has no rales.  GI: Soft. Bowel sounds are normal. There is no tenderness.  Musculoskeletal:       Right ankle: She exhibits no swelling.       Left ankle: She exhibits no swelling.  Lymphadenopathy:    She has no cervical adenopathy.  Neurological: She is alert and oriented to person, place, and time. No cranial nerve deficit.  Right hand coordination improving. Power 5 out of 5 right upper extremity. Able to straight leg raise today on the right side.  Skin: Skin is warm. No rash noted. Nails show no clubbing.  Psychiatric: She has a normal mood and affect.    Data Reviewed: Basic Metabolic Panel:  Recent Labs Lab 02/17/15 1048 02/18/15 0503 02/22/15 0454  NA 135 139 136  K 3.8 3.3* 4.6  CL 100* 104 99*  CO2 GLUCOSE 110* 137* 169*  BUN 18 15 51*  CREATININE 0.73 0.64 0.92  CALCIUM 8.4* 8.0* 8.3*  MG 1.8  --   --   PHOS 3.0  --   --    Liver Function Tests:  Recent Labs Lab 02/17/15 1048  AST 22  ALT 10*  ALKPHOS 58  BILITOT 1.0  PROT 6.5  ALBUMIN 4.0    Recent Labs Lab 02/17/15 1048  LIPASE 31   CBC:  Recent Labs Lab 02/17/15 1048 02/18/15 0503 02/19/15 0135 02/20/15 0435 02/21/15 0515  WBC 3.8 6.3 4.2 10.7 10.5  NEUTROABS 3.1  --   --   --   --  HGB 13.3 12.4 13.5 14.0 12.6  HCT 39.7 36.8 40.8 40.6 36.2  MCV 93.6 91.9 93.2 91.5 89.5  PLT 137* 121* 153 196 196     Studies: Mr Brain Wo Contrast  02/20/2015  CLINICAL DATA:  Right-sided weakness. Left distal common carotid artery/carotid bifurcation occlusion on recent CTA. EXAM: MRI HEAD WITHOUT CONTRAST TECHNIQUE: Multiplanar, multiecho pulse sequences of the brain and surrounding structures were obtained without intravenous contrast. COMPARISON:  Head and neck CTA 02/18/2015 FINDINGS: There is an 8 mm focus of restricted diffusion in the region of the inferior aspect of the posterior limb of the internal capsule/cerebral peduncle on the left. There is also a 5 mm  acute cortical infarct in the left parietal lobe. There are a few scattered foci of chronic microhemorrhage in the right greater than left cerebral hemispheres as well as one in the left cerebellum. There is moderate cerebral atrophy. Patchy T2 hyperintensities in the subcortical and deep cerebral white matter bilaterally are nonspecific but compatible with moderate chronic small vessel ischemic disease. Small, chronic infarcts are noted in the right cerebellum. Prior bilateral cataract extraction is noted. Small volume fluid is present in the left frontal, bilateral sphenoid, and bilateral maxillary sinuses. There is partial opacification of multiple anterior ethmoid air cells bilaterally. Mastoid air cells are clear. Distal left internal carotid artery flow void is abnormal, corresponding to occlusion demonstrated on recent CTA. IMPRESSION: 1. Small acute infarcts in the inferior left internal capsule/cerebral peduncle and left parietal cortex. 2. Moderate chronic small vessel ischemic disease. 3. Chronic right cerebellar infarcts. 4. Paranasal sinus fluid and mucosal thickening, correlate for acute sinusitis. Electronically Signed   By: Sebastian AcheAllen  Grady M.D.   On: 02/20/2015 15:15   Mr Cervical Spine Wo Contrast  02/21/2015  CLINICAL DATA:  Right side weakness. Possible cervical myelopathy. Initial encounter. EXAM: MRI CERVICAL SPINE WITHOUT CONTRAST TECHNIQUE: Multiplanar, multisequence MR imaging of the cervical spine was performed. No intravenous contrast was administered. COMPARISON:  CT cervical spine 12/27/2014. FINDINGS: Facet arthropathy results in approximately 0.2 cm anterolisthesis C3 on C4 and C7 on T1. Trace retrolisthesis C4 on C5 also identified. Vertebral body height and signal are maintained. The C5-6 level is fused. The craniocervical junction is normal. Cervical cord signal is normal. Imaged paraspinous structures are unremarkable. C2-3: Facet arthropathy and a shallow disc bulge without  central canal or foraminal stenosis. C3-4: The disc is slightly uncovered and there is facet arthropathy, worse on the left. The central canal and foramina remain open. C4-5: Small disc osteophyte complex and uncovertebral disease are seen. There is left worse than right facet degenerative disease. There is left worse than right facet arthropathy. The central canal is mildly narrowed. Moderate left foraminal narrowing is identified. The right foramen is open. C5-6: Posterior bony ridging is present. The central canal and foramina are open. C6-7: Left worse than right facet degenerative disease and a shallow disc bulge are seen. The central canal and foramina are open. C7-T1: The disc is slightly uncovered and there is facet degenerative change. The central canal and foramina are open. IMPRESSION: Overall mild spondylosis of the cervical spine most notable at C4-5 as described above. Electronically Signed   By: Drusilla Kannerhomas  Dalessio M.D.   On: 02/21/2015 08:15    Scheduled Meds: . aspirin EC  81 mg Oral Daily  . atorvastatin  40 mg Oral q1800  . budesonide (PULMICORT) nebulizer solution  0.25 mg Nebulization BID  . furosemide  20 mg Oral Daily  . Influenza  vac split quadrivalent PF  0.5 mL Intramuscular Tomorrow-1000  . ipratropium-albuterol  3 mL Nebulization TID  . ketorolac  15 mg Intravenous Once  . levofloxacin (LEVAQUIN) IV  250 mg Intravenous Q24H  . methylPREDNISolone (SOLU-MEDROL) injection  40 mg Intravenous Q8H  . polyethylene glycol  17 g Oral Daily    Assessment/Plan:  1. Acute CVA's with Right-sided weakness. Patient on aspirin and statin. Appreciate physical therapy consultation,patient will go to rehabilitation soon. As per neurology the strokes are likely secondary to low flow. 2. Left carotid stenosis. No further treatment secondary to complete stenosis. 3. Acute bronchitis with asthmatic component- Levaquin. Continue low-dose IV Solu-Medrol and budesonide nebulizers 4. Acute  diastolic congestive heart failure with moderate mitral valve regurgitation. Restart oral Lasix low-dose. No beta blocker with bronchospasm. 5. Thrombocytopenia- has come up with treatment of infection   Code Status:     Code Status Orders        Start     Ordered   02/17/15 1545  Full code   Continuous     02/17/15 1544     Family Communication: Son on the phone Disposition Plan: potential rehabilitation Friday  Consultants:  Neurology  Vascular surgery  Antibiotics: Levaquin  Time spent: 20 minutes  Savannah Friedman  [facility]

## 2015-02-22 NOTE — Progress Notes (Addendum)
Physical Therapy Treatment Patient Details Name: Savannah Friedman MRN: [id number] DOB: [birth year] Today's Date: 02/22/2015    History of Present Illness [facility] with complaints of SOB. She was initially admitted with acute respiratory failure related to acute bronchitis with asthmatic component as well as acute diastolic CHF. Pt subsequently developed RUE weakness although the timing of onset is somewaht unclear. Pt reports that immediately prior to arrival she "slid off" the end of her bed and was on the floor for multiple hours until she could call for emergency services. She sufferred one other fall recently which occurred in October. She was taken via EMS to the hospital but was not found to have any acute bony injuries. Pt denies other falls in the last 12 months. Pt reports that prior to current admission she was living alone and independent with ADLs/IADLs.     PT Comments    Pt reports significant right hip pain; notes she was just medicated for pain. Pt agreeable to ambulation and notes she needs to use the bathroom. Pt continues to demonstrate unsafe technique with holding rolling walker too far out in front of her especially negotiating turns. Pt re educated and improved on second walk. Deferred active exercises with R hip due to pain. Encouraged isometric exercises throughout the day; pt understands. Continue PT for progression with strength, endurance, balance and ambulation to improve functional mobility as pain allows.   Follow Up Recommendations  CIR;SNF     Equipment Recommendations  Rolling walker with 5" wheels    Recommendations for Other Services       Precautions / Restrictions Precautions Precautions: Fall Restrictions Weight Bearing Restrictions: No    Mobility  Bed Mobility               General bed mobility comments: not tested; up in chair  Transfers Overall transfer level: Needs assistance Equipment used: Rolling walker (2  wheeled) Transfers: Sit to/from Stand Sit to Stand: Min assist         General transfer comment: Cues for safe hand placement  Ambulation/Gait Ambulation/Gait assistance: Min guard;Min assist Ambulation Distance (Feet): 15 Feet (2 times) Assistive device: Rolling walker (2 wheeled) Gait Pattern/deviations: Step-through pattern;Trunk flexed (mildly unsteady) Gait velocity: Decreased Gait velocity interpretation: <1.8 ft/sec, indicative of risk for recurrent falls General Gait Details: Slow, cues for correct body position to rw; improved on second walk   Stairs            Wheelchair Mobility    Modified Rankin (Stroke Patients Only)       Balance           Standing balance support: Bilateral upper extremity supported Standing balance-Leahy Scale: Fair                      Cognition Arousal/Alertness: Awake/alert Behavior During Therapy: WFL for tasks assessed/performed Overall Cognitive Status: Within Functional Limits for tasks assessed                      Exercises      General Comments        Pertinent Vitals/Pain Pain Assessment: 0-10 Pain Score: 7  Pain Location: R hip Pain Intervention(s): Limited activity within patient's tolerance;Premedicated before session    Home Living                      Prior Function  PT Goals (current goals can now be found in the care plan section) Progress towards PT goals: Progressing toward goals (slowly; R hip pain inhibiting)    Frequency  7X/week    PT Plan Current plan remains appropriate    Co-evaluation             End of Session Equipment Utilized During Treatment: Gait belt Activity Tolerance: Patient limited by fatigue;Patient limited by pain Patient left: in chair;with call bell/phone within reach;with chair alarm set     Time: 4098-1191 PT Time Calculation (min) (ACUTE ONLY): 17 min  Charges:  $Gait Training: 8-22 mins                    G  Codes:      Kristeen Miss 02/22/2015, 3:25 PM

## 2015-02-23 ENCOUNTER — Encounter
Admission: RE | Admit: 2015-02-23 | Discharge: 2015-02-23 | Disposition: A | Discharge status: Home / Self Care | Payer: Medicare Other | Source: Ambulatory Visit | Attending: Internal Medicine | Admitting: Internal Medicine

## 2015-02-23 LAB — PROTIME-INR
INR: 1.48
PROTHROMBIN TIME: 18 s — AB (ref 11.4–15.0)

## 2015-02-23 MED ORDER — LEVOFLOXACIN 250 MG PO TABS
250.0000 mg | ORAL_TABLET | Freq: Every day | ORAL | Status: DC
  Administered 2015-02-23: 250 mg via ORAL
  Filled 2015-02-23: qty 1

## 2015-02-23 MED ORDER — ASPIRIN 81 MG PO TBEC: 81.0000 mg | DELAYED_RELEASE_TABLET | Freq: Every day | ORAL | Status: AC

## 2015-02-23 MED ORDER — IPRATROPIUM-ALBUTEROL 0.5-2.5 (3) MG/3ML IN SOLN: 3.0000 mL | Freq: Three times a day (TID) | RESPIRATORY_TRACT | Status: AC

## 2015-02-23 MED ORDER — ATORVASTATIN CALCIUM 40 MG PO TABS: 40.0000 mg | ORAL_TABLET | Freq: Every day | ORAL | Status: AC

## 2015-02-23 MED ORDER — LEVOFLOXACIN 250 MG PO TABS: ORAL_TABLET | ORAL | Status: DC

## 2015-02-23 MED ORDER — BISACODYL 10 MG RE SUPP
10.0000 mg | Freq: Once | RECTAL | Status: AC
  Administered 2015-02-23: 10 mg via RECTAL
  Filled 2015-02-23 (×2): qty 1

## 2015-02-23 MED ORDER — ACETAMINOPHEN 325 MG PO TABS: 650.0000 mg | ORAL_TABLET | Freq: Four times a day (QID) | ORAL | Status: DC | PRN

## 2015-02-23 MED ORDER — POLYETHYLENE GLYCOL 3350 17 G PO PACK: 17.0000 g | PACK | Freq: Every day | ORAL | Status: DC | PRN

## 2015-02-23 MED ORDER — PREDNISONE 20 MG PO TABS
30.0000 mg | ORAL_TABLET | Freq: Every day | ORAL | Status: DC
  Administered 2015-02-23: 30 mg via ORAL
  Filled 2015-02-23: qty 1

## 2015-02-23 MED ORDER — PREDNISONE 5 MG PO TABS: ORAL_TABLET | ORAL | Status: DC

## 2015-02-23 NOTE — Progress Notes (Signed)
02/23/2015\ 15:00  Savannah Friedman to be D/C'd Rehab per MD order.  Discussed prescriptions and follow up appointments with the patient. Prescriptions given to patient, medication list explained in detail. Pt verbalized understanding.    Medication List    TAKE these medications        acetaminophen 325 MG tablet  Commonly known as:  TYLENOL  Take 2 tablets (650 mg total) by mouth every 6 (six) hours as needed for mild pain (or Fever >/= 101).     aspirin 81 MG EC tablet  Take 1 tablet (81 mg total) by mouth daily.     atorvastatin 40 MG tablet  Commonly known as:  LIPITOR  Take 1 tablet (40 mg total) by mouth daily at 6 PM.     ipratropium-albuterol 0.5-2.5 (3) MG/3ML Soln  Commonly known as:  DUONEB  Take 3 mLs by nebulization 3 (three) times daily.     levofloxacin 250 MG tablet  Commonly known as:  LEVAQUIN  For four more days     polyethylene glycol packet  Commonly known as:  MIRALAX / GLYCOLAX  Take 17 g by mouth daily as needed for moderate constipation.     predniSONE 5 MG tablet  Commonly known as:  DELTASONE  4 tabs day1; 3 tabs day2; 2 tabs day3, 1 tab day4,5  Start taking on:  02/24/2015        Filed Vitals:   02/23/15 0558 02/23/15 1248  BP: 146/80 165/67  Pulse: 94 65  Temp: 97.6 F (36.4 C)   Resp: 18 18    Skin clean, dry and intact without evidence of skin break down, no evidence of skin tears noted. IV catheter discontinued intact. Site without signs and symptoms of complications. Dressing and pressure applied. Pt denies pain at this time. No complaints noted.  Report was given to nurse accepting pt. Patient escorted via WC, and D/C to [facility] via private auto.  Bradly Chrisougherty, Vidalia Serpas E

## 2015-02-23 NOTE — Discharge Summary (Signed)
[facility] - [place] at [facility]   PATIENT NAME: Savannah Friedman    MR#:  [id number]  DATE OF BIRTH:  06-May-1923  DATE OF ADMISSION:  02/17/2015 ADMITTING PHYSICIAN: Adrian Saran, MD  DATE OF DISCHARGE: 02/23/2015  PRIMARY CARE PHYSICIAN: Curtis Sites III, MD    ADMISSION DIAGNOSIS:  Bilateral pneumonia [J18.9] Acute respiratory failure with hypoxia (HCC) [J96.01]  DISCHARGE DIAGNOSIS:  Active Problems:   Pneumonia   SECONDARY DIAGNOSIS:   Past Medical History  Diagnosis Date  . Anemia     HOSPITAL COURSE:   1. Acute hypoxic respiratory failure. The patient came in with shortness of breath and the patient had a wheezing and coughing. Initial chest x-ray could be fluid versus chronic changes. The patient was initially given some diuresis and antibiotic and nebulizer treatments. The patient was on oxygen most of the entire stay and was tapered off oxygen a few days ago. Breathing comfortably on room air. 2. Acute bronchitis with asthmatic component- patient was placed on Solu-Medrol and Levaquin and nebulizer treatments. Still has a little bit a rhonchi in the lungs upon discharge but breathing much better. Stable for discharge to rehabilitation with prednisone taper and completion of course of Levaquin(4 more days and then 02/28/2015). 3. 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 Patient was seen in consultation by neurology and vascular surgery. Patient woke up the morning of 11/27 with right-sided weakness and incoordination of the right hand. Initial CAT scan of the head was negative. Ultrasound of the carotid showed almost complete blockage. CT angios and neck did show complete blockage of the left carotid. MRI of the brain didn't show acute strokes in neurology believe this was secondary to low flow. Patient was started on aspirin and statin. The patient did have a temporary course of heparin drip when we were deciding on  whether the carotid artery would be a surgical candidate or not. Since the patient does have a complete blockage she is not a surgical candidate. 4. Acute diastolic congestive heart failure moderate mitral valve regurgitation and history of valve replacement. Patient was diuresed with Lasix and did not improve with regards to her respiratory status. I believe that this is all secondary to the asthmatic bronchitis. Lasix was stopped. I do not believe that this is heart failure. Of course no beta blocker with bronchospasm. 5. Thrombocytopenia platelet count has been variable over the years and improve with treatment of infection.  DISCHARGE CONDITIONS:   Satisfactory  CONSULTS OBTAINED:  Treatment Team:  Annice Needy, MD  DRUG ALLERGIES:   Allergies  Allergen Reactions  . Codeine Nausea And Vomiting    DISCHARGE MEDICATIONS:   Current Discharge Medication List    START taking these medications   Details  acetaminophen (TYLENOL) 325 MG tablet Take 2 tablets (650 mg total) by mouth every 6 (six) hours as needed for mild pain (or Fever >/= 101).    aspirin EC 81 MG EC tablet Take 1 tablet (81 mg total) by mouth daily.    atorvastatin (LIPITOR) 40 MG tablet Take 1 tablet (40 mg total) by mouth daily at 6 PM.    ipratropium-albuterol (DUONEB) 0.5-2.5 (3) MG/3ML SOLN Take 3 mLs by nebulization 3 (three) times daily. Qty: 360 mL    levofloxacin (LEVAQUIN) 250 MG tablet For four more days Qty: 4 tablet, Refills: 0    polyethylene glycol (MIRALAX / GLYCOLAX) packet Take 17 g by mouth daily as needed for moderate constipation. Qty:  14 each, Refills: 0    predniSONE (DELTASONE) 5 MG tablet 4 tabs day1; 3 tabs day2; 2 tabs day3, 1 tab day4,5 Qty: 11 tablet, Refills: 0         DISCHARGE INSTRUCTIONS:   Follow-up with Dr. rehabilitation 1 day Follow-up with Dr. Worthy Keeler one week after discharge from rehabilitation. Can follow-up with neurology at the current total clinic in 3  weeks or so  If you experience worsening of your admission symptoms, develop shortness of breath, life threatening emergency, suicidal or homicidal thoughts you must seek medical attention immediately by calling 911 or calling your MD immediately  if symptoms less severe.  You Must read complete instructions/literature along with all the possible adverse reactions/side effects for all the Medicines you take and that have been prescribed to you. Take any new Medicines after you have completely understood and accept all the possible adverse reactions/side effects.   Please note  You were cared for by a hospitalist during your hospital stay. If you have any questions about your discharge medications or the care you received while you were in the hospital after you are discharged, you can call the unit and asked to speak with the hospitalist on call if the hospitalist that took care of you is not available. Once you are discharged, your primary care physician will handle any further medical issues. Please note that NO REFILLS for any discharge medications will be authorized once you are discharged, as it is imperative that you return to your primary care physician (or establish a relationship with a primary care physician if you do not have one) for your aftercare needs so that they can reassess your need for medications and monitor your lab values.    Today   CHIEF COMPLAINT:   Chief Complaint  Patient presents with  . Weakness    HISTORY OF PRESENT ILLNESS:  Savannah Friedman  is a 81 y.o. female presented with weakness and shortness of breath.   VITAL SIGNS:  Blood pressure 146/80, pulse 94, temperature 97.6 F (36.4 C), temperature source Oral, resp. rate 18, height  (1.549 m), weight 61.916 kg (136 lb 8 oz), SpO2 95 %.    PHYSICAL EXAMINATION:  GENERAL:  81 y.o.-year-old patient lying in the bed with no acute distress.  EYES: Pupils equal, round, reactive to light and  accommodation. No scleral icterus. Extraocular muscles intact.  HEENT: Head atraumatic, normocephalic. Oropharynx and nasopharynx clear.  NECK:  Supple, no jugular venous distention. No thyroid enlargement, no tenderness.  LUNGS: Normal breath sounds bilaterally, rhonchi at bases. no wheezing, rales.  No use of accessory muscles of respiration.  CARDIOVASCULAR: S1, S2 normal. 3 and a 6 systolic murmur, no rubs, or gallops.  ABDOMEN: Soft, non-tender, non-distended. Bowel sounds present. No organomegaly or mass.  EXTREMITIES: Trace pedal edema, no cyanosis, or clubbing.  NEUROLOGIC: Cranial nerves II through XII are intact. Muscle strength 5/5 in all extremities. Sensation intact. Gait not checked.  PSYCHIATRIC: The patient is alert and oriented x 3.  SKIN: No obvious rash, lesion, or ulcer.   DATA REVIEW:   CBC  Recent Labs Lab 02/21/15 0515  WBC 10.5  HGB 12.6  HCT 36.2  PLT 196    Chemistries   Recent Labs Lab 02/17/15 1048  02/22/15 0454  NA 135  < > 136  K 3.8  < > 4.6  CL 100*  < > 99*  CO2 26  < > 29  GLUCOSE 110*  < > 169*  BUN 18  < > 51*  CREATININE 0.73  < > 0.92  CALCIUM 8.4*  < > 8.3*  MG 1.8  --   --   AST 22  --   --   ALT 10*  --   --   ALKPHOS 58  --   --   BILITOT 1.0  --   --   < > = values in this interval not displayed.  Cardiac Enzymes  Recent Labs Lab 02/17/15 1048  TROPONINI <0.03    Microbiology Results  Results for orders placed or performed during the hospital encounter of 02/17/15  Urine culture     Status: None   Collection Time: 02/17/15 10:48 AM  Result Value Ref Range Status   Specimen Description URINE, RANDOM  Final   Special Requests NONE  Final   Culture MULTIPLE SPECIES PRESENT, SUGGEST RECOLLECTION  Final   Report Status 02/18/2015 FINAL  Final    RADIOLOGY:  Dg Chest 1 View  02/22/2015  CLINICAL DATA:  Patient with cough.  History of pneumonia. EXAM: CHEST 1 VIEW COMPARISON:  Chest radiograph 02/17/2015. FINDINGS:  Patient is rotated. Stable cardiac and mediastinal contours given differences in patient positioning. No consolidative pulmonary opacities. No pleural effusion or pneumothorax. Epicardial pacing wires. Valve replacement. IMPRESSION: No acute cardiopulmonary process. Electronically Signed   By: Annia Beltrew  Davis M.D.   On: 02/22/2015 16:01    Management plans discussed with the patient, family and they are in agreement.  CODE STATUS:     Code Status Orders        Start     Ordered   02/17/15 1545  Full code   Continuous     02/17/15 1544      TOTAL TIME TAKING CARE OF THIS PATIENT: 35  minutes.    Alford HighlandWIETING, Kineta Fudala M.D on 02/23/2015 at 7:59 AM  Between 7am to 6pm - Pager - [phone]  After 6pm go to [email or web address] - password [email or web address] [facility]  [facility]  Office  [phone]  CC: Primary care physician; Lynnea FerrierBERT J KLEIN III, MD

## 2015-02-23 NOTE — Care Management Important Message (Signed)
Important Message  Patient Details  Name: Savannah Friedman MRN: [id number] Date of Birth: 05-16-1923   Medicare Important Message Given:  Yes    Olegario MessierKathy A Henry Utsey 02/23/2015, 9:55 AM

## 2015-02-23 NOTE — Clinical Social Work Placement (Signed)
   CLINICAL SOCIAL WORK PLACEMENT  NOTE  Date:  02/23/2015  Patient Details  Name: Savannah Friedman MRN: [id number] Date of Birth: Aug 22, 1923  Clinical Social Work is seeking post-discharge placement for this patient at the Skilled  Nursing Facility level of care (*CSW will initial, date and re-position this form in  chart as items are completed):  Yes   Patient/family provided with [facility] Clinical Social Work Department's list of facilities offering this level of care within the geographic area requested by the patient (or if unable, by the patient's family).  Yes   Patient/family informed of their freedom to choose among providers that offer the needed level of care, that participate in Medicare, Medicaid or managed care program needed by the patient, have an available bed and are willing to accept the patient.  Yes   Patient/family informed of [facility]'s ownership interest in [facility] and [facility], as well as of the fact that they are under no obligation to receive care at these facilities.  PASRR submitted to EDS on 02/21/15     PASRR number received on 02/21/15     Existing PASRR number confirmed on       FL2 transmitted to all facilities in geographic area requested by pt/family on 02/21/15     FL2 transmitted to all facilities within larger geographic area on       Patient informed that his/her managed care company has contracts with or will negotiate with certain facilities, including the following:        Yes   Patient/family informed of bed offers received.  [facility] bed [facility]     Physician recommends and patient chooses bed at      [facility] 02[facility].  Patient to be transferred to facility by family     Patient family notified on 02/23/15 of transfer.  Name of family member notified:  daughter      PHYSICIAN Please sign FL2, Please prepare prescriptions     Additional Comment:     _______________________________________________ Ned Cardara N Priscila Bean, LCSW 02/23/2015, 9:54 AM

## 2015-03-14 DIAGNOSIS — E782 Mixed hyperlipidemia: Secondary | ICD-10-CM | POA: Insufficient documentation

## 2015-03-19 DIAGNOSIS — I739 Peripheral vascular disease, unspecified: Secondary | ICD-10-CM | POA: Insufficient documentation

## 2015-03-20 DIAGNOSIS — I63232 Cerebral infarction due to unspecified occlusion or stenosis of left carotid arteries: Secondary | ICD-10-CM | POA: Insufficient documentation

## 2015-04-30 DIAGNOSIS — R600 Localized edema: Secondary | ICD-10-CM | POA: Insufficient documentation

## 2015-06-07 DIAGNOSIS — D692 Other nonthrombocytopenic purpura: Secondary | ICD-10-CM | POA: Insufficient documentation

## 2015-08-13 ENCOUNTER — Ambulatory Visit: Payer: Medicare Other | Admitting: Surgery

## 2015-09-14 ENCOUNTER — Other Ambulatory Visit: Payer: Self-pay | Admitting: Rheumatology

## 2015-09-14 DIAGNOSIS — M545 Low back pain: Secondary | ICD-10-CM

## 2015-09-15 ENCOUNTER — Other Ambulatory Visit: Payer: Self-pay

## 2015-09-15 ENCOUNTER — Emergency Department: Payer: Medicare Other

## 2015-09-15 ENCOUNTER — Inpatient Hospital Stay
Admission: EM | Admit: 2015-09-15 | Discharge: 2015-09-18 | DRG: 291 | Disposition: A | Discharge status: Skilled Nursing Facility | Payer: Medicare Other | Attending: Internal Medicine | Admitting: Internal Medicine

## 2015-09-15 DIAGNOSIS — J9601 Acute respiratory failure with hypoxia: Secondary | ICD-10-CM | POA: Diagnosis present

## 2015-09-15 DIAGNOSIS — I509 Heart failure, unspecified: Secondary | ICD-10-CM

## 2015-09-15 DIAGNOSIS — J069 Acute upper respiratory infection, unspecified: Secondary | ICD-10-CM | POA: Diagnosis present

## 2015-09-15 DIAGNOSIS — E785 Hyperlipidemia, unspecified: Secondary | ICD-10-CM | POA: Diagnosis present

## 2015-09-15 DIAGNOSIS — R0902 Hypoxemia: Secondary | ICD-10-CM

## 2015-09-15 DIAGNOSIS — Z952 Presence of prosthetic heart valve: Secondary | ICD-10-CM | POA: Diagnosis not present

## 2015-09-15 DIAGNOSIS — I739 Peripheral vascular disease, unspecified: Secondary | ICD-10-CM | POA: Diagnosis present

## 2015-09-15 DIAGNOSIS — Z8673 Personal history of transient ischemic attack (TIA), and cerebral infarction without residual deficits: Secondary | ICD-10-CM | POA: Diagnosis not present

## 2015-09-15 DIAGNOSIS — D509 Iron deficiency anemia, unspecified: Secondary | ICD-10-CM | POA: Diagnosis present

## 2015-09-15 DIAGNOSIS — Z79899 Other long term (current) drug therapy: Secondary | ICD-10-CM | POA: Diagnosis not present

## 2015-09-15 DIAGNOSIS — J189 Pneumonia, unspecified organism: Secondary | ICD-10-CM

## 2015-09-15 DIAGNOSIS — R0602 Shortness of breath: Secondary | ICD-10-CM

## 2015-09-15 DIAGNOSIS — Z7982 Long term (current) use of aspirin: Secondary | ICD-10-CM

## 2015-09-15 DIAGNOSIS — I5033 Acute on chronic diastolic (congestive) heart failure: Secondary | ICD-10-CM | POA: Diagnosis present

## 2015-09-15 DIAGNOSIS — Z87891 Personal history of nicotine dependence: Secondary | ICD-10-CM

## 2015-09-15 DIAGNOSIS — I340 Nonrheumatic mitral (valve) insufficiency: Secondary | ICD-10-CM | POA: Diagnosis present

## 2015-09-15 DIAGNOSIS — I482 Chronic atrial fibrillation: Secondary | ICD-10-CM | POA: Diagnosis present

## 2015-09-15 DIAGNOSIS — J40 Bronchitis, not specified as acute or chronic: Secondary | ICD-10-CM | POA: Diagnosis present

## 2015-09-15 DIAGNOSIS — M353 Polymyalgia rheumatica: Secondary | ICD-10-CM | POA: Diagnosis present

## 2015-09-15 HISTORY — DX: Cerebral infarction, unspecified: I63.9

## 2015-09-15 HISTORY — DX: Heart failure, unspecified: I50.9

## 2015-09-15 HISTORY — DX: Presence of prosthetic heart valve: Z95.2

## 2015-09-15 HISTORY — DX: Hyperlipidemia, unspecified: E78.5

## 2015-09-15 HISTORY — DX: Peripheral vascular disease, unspecified: I73.9

## 2015-09-15 HISTORY — DX: Unspecified atrial fibrillation: I48.91

## 2015-09-15 HISTORY — DX: Polymyalgia rheumatica: M35.3

## 2015-09-15 LAB — CBC WITH DIFFERENTIAL/PLATELET
BASOS ABS: 0 10*3/uL (ref 0–0.1)
BASOS PCT: 0 %
Eosinophils Absolute: 0 10*3/uL (ref 0–0.7)
Eosinophils Relative: 0 %
HEMATOCRIT: 26.8 % — AB (ref 35.0–47.0)
HEMOGLOBIN: 8.6 g/dL — AB (ref 12.0–16.0)
LYMPHS PCT: 7 %
Lymphs Abs: 0.4 10*3/uL — ABNORMAL LOW (ref 1.0–3.6)
MCH: 26.6 pg (ref 26.0–34.0)
MCHC: 32.1 g/dL (ref 32.0–36.0)
MCV: 82.8 fL (ref 80.0–100.0)
MONO ABS: 0.3 10*3/uL (ref 0.2–0.9)
MONOS PCT: 5 %
NEUTROS ABS: 5.6 10*3/uL (ref 1.4–6.5)
NEUTROS PCT: 88 %
Platelets: 313 10*3/uL (ref 150–440)
RBC: 3.24 MIL/uL — ABNORMAL LOW (ref 3.80–5.20)
RDW: 18.4 % — ABNORMAL HIGH (ref 11.5–14.5)
WBC: 6.4 10*3/uL (ref 3.6–11.0)

## 2015-09-15 LAB — IRON AND TIBC
IRON: 10 ug/dL — AB (ref 28–170)
Saturation Ratios: 2 % — ABNORMAL LOW (ref 10.4–31.8)
TIBC: 453 ug/dL — ABNORMAL HIGH (ref 250–450)
UIBC: 443 ug/dL

## 2015-09-15 LAB — BASIC METABOLIC PANEL
ANION GAP: 11 (ref 5–15)
BUN: 24 mg/dL — ABNORMAL HIGH (ref 6–20)
CHLORIDE: 103 mmol/L (ref 101–111)
CO2: 21 mmol/L — AB (ref 22–32)
Calcium: 8.6 mg/dL — ABNORMAL LOW (ref 8.9–10.3)
Creatinine, Ser: 0.66 mg/dL (ref 0.44–1.00)
GFR calc non Af Amer: >60 mL/min (ref >60)
GLUCOSE: 152 mg/dL — AB (ref 65–99)
Potassium: 3.7 mmol/L (ref 3.5–5.1)
Sodium: 135 mmol/L (ref 135–145)

## 2015-09-15 LAB — TROPONIN I
TROPONIN I: 0.04 ng/mL — AB (ref <0.031)
Troponin I: <0.03 ng/mL (ref <0.031)

## 2015-09-15 LAB — URINALYSIS COMPLETE WITH MICROSCOPIC (ARMC ONLY)
Bilirubin Urine: NEGATIVE
Glucose, UA: NEGATIVE mg/dL
Hgb urine dipstick: NEGATIVE
NITRITE: NEGATIVE
PROTEIN: 30 mg/dL — AB
SPECIFIC GRAVITY, URINE: 1.029 (ref 1.005–1.030)
pH: 5 (ref 5.0–8.0)

## 2015-09-15 LAB — LACTIC ACID, PLASMA
LACTIC ACID, VENOUS: 2.8 mmol/L — AB (ref 0.5–2.0)
LACTIC ACID, VENOUS: 2.6 mmol/L — AB (ref 0.5–2.0)

## 2015-09-15 LAB — FERRITIN: Ferritin: 14 ng/mL (ref 11–307)

## 2015-09-15 LAB — RETICULOCYTES
RBC.: 3.12 MIL/uL — ABNORMAL LOW (ref 3.80–5.20)
RETIC COUNT ABSOLUTE: 156 10*3/uL (ref 19.0–183.0)
Retic Ct Pct: 5 % — ABNORMAL HIGH (ref 0.4–3.1)

## 2015-09-15 LAB — TSH: TSH: 1.817 u[IU]/mL (ref 0.350–4.500)

## 2015-09-15 LAB — FOLATE: FOLATE: 17.5 ng/mL (ref >5.9)

## 2015-09-15 LAB — BRAIN NATRIURETIC PEPTIDE: B NATRIURETIC PEPTIDE 5: 526 pg/mL — AB (ref 0.0–100.0)

## 2015-09-15 LAB — VITAMIN B12: VITAMIN B 12: 1337 pg/mL — AB (ref 180–914)

## 2015-09-15 MED ORDER — FUROSEMIDE 10 MG/ML IJ SOLN
40.0000 mg | Freq: Once | INTRAMUSCULAR | Status: AC
  Administered 2015-09-15: 40 mg via INTRAVENOUS
  Filled 2015-09-15: qty 4

## 2015-09-15 MED ORDER — DEXTROSE 5 % IV SOLN
500.0000 mg | INTRAVENOUS | Status: DC
  Filled 2015-09-15: qty 500

## 2015-09-15 MED ORDER — AZITHROMYCIN 250 MG PO TABS
500.0000 mg | ORAL_TABLET | Freq: Every day | ORAL | Status: DC
  Administered 2015-09-16 – 2015-09-17 (×2): 500 mg via ORAL
  Filled 2015-09-15 (×2): qty 2

## 2015-09-15 MED ORDER — ONDANSETRON HCL 4 MG/2ML IJ SOLN: 4.0000 mg | Freq: Four times a day (QID) | INTRAMUSCULAR | Status: DC | PRN

## 2015-09-15 MED ORDER — ASPIRIN EC 81 MG PO TBEC
81.0000 mg | DELAYED_RELEASE_TABLET | Freq: Every day | ORAL | Status: DC
  Administered 2015-09-15 – 2015-09-17 (×3): 81 mg via ORAL
  Filled 2015-09-15 (×3): qty 1

## 2015-09-15 MED ORDER — SENNOSIDES-DOCUSATE SODIUM 8.6-50 MG PO TABS
1.0000 | ORAL_TABLET | Freq: Every evening | ORAL | Status: DC | PRN
  Administered 2015-09-15 – 2015-09-17 (×3): 1 via ORAL
  Filled 2015-09-15 (×4): qty 1

## 2015-09-15 MED ORDER — ONDANSETRON HCL 4 MG PO TABS: 4.0000 mg | ORAL_TABLET | Freq: Four times a day (QID) | ORAL | Status: DC | PRN

## 2015-09-15 MED ORDER — ASPIRIN EC 81 MG PO TBEC: 81.0000 mg | DELAYED_RELEASE_TABLET | Freq: Every day | ORAL | Status: DC

## 2015-09-15 MED ORDER — DEXTROSE 5 % IV SOLN
500.0000 mg | Freq: Once | INTRAVENOUS | Status: AC
  Administered 2015-09-15: 500 mg via INTRAVENOUS
  Filled 2015-09-15: qty 500

## 2015-09-15 MED ORDER — LEVOFLOXACIN IN D5W 750 MG/150ML IV SOLN: 750.0000 mg | Freq: Once | INTRAVENOUS | Status: DC

## 2015-09-15 MED ORDER — ACETAMINOPHEN 650 MG RE SUPP: 650.0000 mg | Freq: Four times a day (QID) | RECTAL | Status: DC | PRN

## 2015-09-15 MED ORDER — ENOXAPARIN SODIUM 40 MG/0.4ML ~~LOC~~ SOLN
40.0000 mg | SUBCUTANEOUS | Status: DC
  Administered 2015-09-15: 40 mg via SUBCUTANEOUS
  Filled 2015-09-15 (×2): qty 0.4

## 2015-09-15 MED ORDER — ACETAMINOPHEN 325 MG PO TABS
650.0000 mg | ORAL_TABLET | Freq: Four times a day (QID) | ORAL | Status: DC | PRN
  Administered 2015-09-15 – 2015-09-17 (×3): 650 mg via ORAL
  Filled 2015-09-15 (×3): qty 2

## 2015-09-15 MED ORDER — DEXTROSE 5 % IV SOLN
1.0000 g | INTRAVENOUS | Status: DC
  Administered 2015-09-16 – 2015-09-17 (×2): 1 g via INTRAVENOUS
  Filled 2015-09-15 (×4): qty 10

## 2015-09-15 MED ORDER — SODIUM CHLORIDE 0.9 % IV BOLUS (SEPSIS)
500.0000 mL | Freq: Once | INTRAVENOUS | Status: AC
  Administered 2015-09-15: 500 mL via INTRAVENOUS

## 2015-09-15 MED ORDER — POLYETHYLENE GLYCOL 3350 17 G PO PACK
17.0000 g | PACK | Freq: Every day | ORAL | Status: DC
  Administered 2015-09-15 – 2015-09-18 (×3): 17 g via ORAL
  Filled 2015-09-15 (×3): qty 1

## 2015-09-15 MED ORDER — DEXTROSE 5 % IV SOLN
1.0000 g | Freq: Once | INTRAVENOUS | Status: AC
  Administered 2015-09-15: 1 g via INTRAVENOUS
  Filled 2015-09-15: qty 10

## 2015-09-15 MED ORDER — SODIUM CHLORIDE 0.9% FLUSH
3.0000 mL | Freq: Two times a day (BID) | INTRAVENOUS | Status: DC
  Administered 2015-09-15 – 2015-09-18 (×5): 3 mL via INTRAVENOUS

## 2015-09-15 NOTE — Progress Notes (Signed)
New order for ASA 81 at HS per pt. Request given by Dr. Sheryle Hailiamond.

## 2015-09-15 NOTE — ED Provider Notes (Signed)
[facility] Emergency Department Provider Note    ____________________________________________  Time seen: ~1200  I have reviewed the triage vital signs and the nursing notes.   HISTORY  Chief Complaint Shortness of breath  History limited by: Not Limited   HPI Savannah Friedman is a 82 y.o. female who presents to the emergency department today because of concerns for shortness of breath. It started yesterday morning. It has been constant and slightly worsening since then. It is worse with exertion. She denies any chest pain. Denies any cough. Denies similar symptoms in the past. Does state she has a history of anemia and has required transfusions in the past. Denies any fevers.   Past Medical History  Diagnosis Date  . Anemia     Patient Active Problem List   Diagnosis Date Noted  . Pneumonia 02/17/2015    Past Surgical History  Procedure Laterality Date  . Cardiac valve replacement      Current Outpatient Rx  Name  Route  Sig  Dispense  Refill  . acetaminophen (TYLENOL) 325 MG tablet   Oral   Take 2 tablets (650 mg total) by mouth every 6 (six) hours as needed for mild pain (or Fever >/= 101).         Marland Kitchen. aspirin EC 81 MG EC tablet   Oral   Take 1 tablet (81 mg total) by mouth daily.         Marland Kitchen. atorvastatin (LIPITOR) 40 MG tablet   Oral   Take 1 tablet (40 mg total) by mouth daily at 6 PM.         . ipratropium-albuterol (DUONEB) 0.5-2.5 (3) MG/3ML SOLN   Nebulization   Take 3 mLs by nebulization 3 (three) times daily.   360 mL      . levofloxacin (LEVAQUIN) 250 MG tablet      For four more days   4 tablet   0   . polyethylene glycol (MIRALAX / GLYCOLAX) packet   Oral   Take 17 g by mouth daily as needed for moderate constipation.   14 each   0   . predniSONE (DELTASONE) 5 MG tablet      4 tabs day1; 3 tabs day2; 2 tabs day3, 1 tab day4,5   11 tablet   0     Allergies Codeine  No family history on  file.  Social History Social History  Substance Use Topics  . Smoking status: Former Smoker -- 30 years    Types: Cigarettes  . Smokeless tobacco: Not on file  . Alcohol Use: 4.2 oz/week    7 Glasses of wine per week    Review of Systems  Constitutional: Negative for fever. Cardiovascular: Negative for chest pain. Respiratory: Positive for shortness of breath. Gastrointestinal: Negative for abdominal pain, vomiting and diarrhea. Neurological: Negative for headaches, focal weakness or numbness.  10-point ROS otherwise negative.  ____________________________________________   PHYSICAL EXAM:  VITAL SIGNS:  97.6 113 16 147/71 mmHg 93 %   Constitutional: Alert and oriented. Well appearing and in no distress. Eyes: Conjunctivae are normal. PERRL. Normal extraocular movements. ENT   Head: Normocephalic and atraumatic.   Nose: No congestion/rhinnorhea.   Mouth/Throat: Mucous membranes are moist.   Neck: No stridor. Hematological/Lymphatic/Immunilogical: No cervical lymphadenopathy. Cardiovascular: Normal rate, regular rhythm.  No murmurs, rubs, or gallops. Respiratory: Normal respiratory effort without tachypnea nor retractions. Breath sounds are clear and equal bilaterally. No wheezes/rales/rhonchi. Gastrointestinal: Soft and nontender. No distention.  Genitourinary: Deferred Musculoskeletal:  Normal range of motion in all extremities. No joint effusions.  No lower extremity tenderness nor edema. Neurologic:  Normal speech and language. No gross focal neurologic deficits are appreciated.  Skin:  Skin is warm, dry and intact. No rash noted. Psychiatric: Mood and affect are normal. Speech and behavior are normal. Patient exhibits appropriate insight and judgment.  ____________________________________________    LABS (pertinent positives/negatives)  Lactic acid 2.8 WBC 6.4 Hgb 8.6 Na 135 K 3.7 Cr  0.66   ____________________________________________  EKG  I, Phineas SemenGraydon Zakkiyya Barno, attending physician, personally viewed and interpreted this EKG  EKG Time: 1123 Rate: 110 Rhythm: sinus tachycardia Axis: normal Intervals: qtc 452 QRS: narrow ST changes: no st elevation Impression: abnormal ekg ____________________________________________    RADIOLOGY  CXR  IMPRESSION: 1. Findings suggestive of interstitial edema with small bilateral pleural effusions. There may be additional infiltrates in both lower lung zones. 2. Aortic atherosclerosis.  ____________________________________________   PROCEDURES  Procedure(s) performed: None  Critical Care performed: No  ____________________________________________   INITIAL IMPRESSION / ASSESSMENT AND PLAN / ED COURSE  Pertinent labs & imaging results that were available during my care of the patient were reviewed by me and considered in my medical decision making (see chart for details).  She presented to the emergency department today because of concerns for shortness of breath. Patient was hypoxic on room air into the 80s. Chest x-ray concerning for some edema as well as possible infiltrates in both lower lungs. White blood cell count was normal however the patient did have an elevated lactate. Given this patient was put on IV antibiotics. Will plan on admission to hospital service.  ____________________________________________   FINAL CLINICAL IMPRESSION(S) / ED DIAGNOSES  Final diagnoses:  Community acquired pneumonia  Shortness of breath  Hypoxia     Note: This dictation was prepared with Office managerDragon dictation. Any transcriptional errors that result from this process are unintentional    Phineas SemenGraydon Orestes Geiman, MD 09/15/15 [phone]

## 2015-09-15 NOTE — ED Notes (Signed)
Pt from home via EMS, reports worsening SOB for the past few days. Pt was 80% on RA, currently on 3L [place].

## 2015-09-15 NOTE — Clinical Social Work Note (Signed)
Admitting physician consulted CSW stating patient is from a facility. Documentation states patient is from home. Please reconsult CSW if further needs arise. York SpanielMonica Poppi Scantling MSW,LCSW [phone]

## 2015-09-15 NOTE — H&P (Signed)
[facility] [place] at [facility]   PATIENT NAME: Savannah Friedman    MR#:  [id number]  DATE OF BIRTH:  [birth year]  DATE OF ADMISSION:  09/15/2015  PRIMARY CARE PHYSICIAN: Curtis SitesBERT J KLEIN III, MD   REQUESTING/REFERRING PHYSICIAN:  Dr Derrill KayGoodman  CHIEF COMPLAINT:   SOB  HISTORY OF PRESENT ILLNESS:  Savannah Friedman  is a 82 y.o. female with a known history of diastolic heart failure [facility] with normal ejection fraction and mild to moderate MR and atrial fibrillation who presents with shortness of breath for the past 2 days. Patient denies cough, fever, chills or lower extremity edema. She denies chest pain or pressure. Chest x-ray in the emergency room shows pulmonary edema and bilateral infiltrates. She was found to have oxygen saturation of 80% on room air. She does not wear oxygen at home.  PAST MEDICAL HISTORY:   Past Medical History  Diagnosis Date  . Anemia   . CHF (congestive heart failure) (HCC)   . PMR (polymyalgia rheumatica) (HCC)   . Atrial fibrillation (HCC)   . CVA (cerebral infarction)   . S/P MVR (mitral valve replacement)   . HLD (hyperlipidemia)   . PVD (peripheral vascular disease) (HCC)     PAST SURGICAL HISTORY:   Past Surgical History  Procedure Laterality Date  . Cardiac valve replacement      SOCIAL HISTORY:   Social History  Substance Use Topics  . Smoking status: Former Smoker -- 30 years    Types: Cigarettes  . Smokeless tobacco: No  . Alcohol Use: 4.2 oz/week    7 Glasses of wine per week    FAMILY HISTORY:   Sr. with breast cancer DRUG ALLERGIES:   Allergies  Allergen Reactions  . Calcitonin ([place])     Other reaction(s): Other (See Comments) Nasal irritation  . Codeine Nausea And Vomiting    REVIEW OF SYSTEMS:   Review of Systems  Constitutional: Negative for fever, chills and malaise/fatigue.  HENT: Negative for ear discharge, ear pain, hearing loss, nosebleeds and sore throat.   Eyes:  Negative for blurred vision and pain.  Respiratory: Positive for shortness of breath. Negative for cough, hemoptysis and wheezing.   Cardiovascular: Positive for palpitations. Negative for chest pain and leg swelling.  Gastrointestinal: Negative for nausea, vomiting, abdominal pain, diarrhea and blood in stool.  Genitourinary: Negative for dysuria.  Musculoskeletal: Negative for back pain.  Neurological: Negative for dizziness, tremors, speech change, focal weakness, seizures and headaches.  Endo/Heme/Allergies: Does not bruise/bleed easily.  Psychiatric/Behavioral: Negative for depression, suicidal ideas and hallucinations.    MEDICATIONS AT HOME:   Prior to Admission medications   Medication Sig Start Date End Date Taking? Authorizing Provider  aspirin EC 81 MG EC tablet Take 1 tablet (81 mg total) by mouth daily. 02/23/15  Yes Alford Highlandichard Wieting, MD  atorvastatin (LIPITOR) 40 MG tablet Take 1 tablet (40 mg total) by mouth daily at 6 PM. 02/23/15  Yes Alford Highlandichard Wieting, MD  HYDROcodone-acetaminophen (NORCO/VICODIN) 5-325 MG tablet Take 1 tablet by mouth every 6 (six) hours as needed. For pain for up to 10 days. 09/13/15 09/23/15 Yes Historical Provider, MD  ipratropium-albuterol (DUONEB) 0.5-2.5 (3) MG/3ML SOLN Take 3 mLs by nebulization 3 (three) times daily. 02/23/15  Yes Richard Renae GlossWieting, MD  timolol (TIMOPTIC) 0.5 % ophthalmic solution Place 1 drop into both eyes daily. 09/02/15  Yes Historical Provider, MD  acetaminophen (TYLENOL) 325 MG tablet Take 2 tablets (650 mg total) by mouth every  6 (six) hours as needed for mild pain (or Fever >/= 101). 02/23/15   Alford Highlandichard Wieting, MD  levofloxacin [facility](LEVAQUIN) 250 MG tablet For four more days 02/23/15   Alford Highlandichard Wieting, MD  predniSONE (DELTASONE) 5 MG tablet 4 tabs day1; 3 tabs day2; 2 tabs day3, 1 tab day4,5 02/24/15   Alford Highlandichard Wieting, MD      VITAL SIGNS:  Blood pressure 125/72, pulse 102, temperature 97.6 F (36.4 C), temperature source Oral, resp. rate  17, SpO2 95 %.  PHYSICAL EXAMINATION:   Physical Exam  Constitutional: She is oriented to person, place, and time and well-developed, well-nourished, and in no distress. No distress.  HENT:  Head: Normocephalic.  Eyes: No scleral icterus.  Neck: Normal range of motion. Neck supple. No JVD present. No tracheal deviation present.  Cardiovascular: Normal rate, regular rhythm and normal heart sounds.  Exam reveals no gallop and no friction rub.   No murmur heard. Pulmonary/Chest: Effort normal. No respiratory distress. She has no wheezes. She has no rales. She exhibits no tenderness.  Crackles at bases  Abdominal: Soft. Bowel sounds are normal. She exhibits no distension and no mass. There is no tenderness. There is no rebound and no guarding.  Musculoskeletal: Normal range of motion. She exhibits no edema.  Neurological: She is alert and oriented to person, place, and time.  Skin: Skin is warm. No rash noted. No erythema.  Psychiatric: Affect and judgment normal.      LABORATORY PANEL:   CBC  Recent Labs Lab 09/15/15 1125  WBC 6.4  HGB 8.6*  HCT 26.8*  PLT 313   ------------------------------------------------------------------------------------------------------------------  Chemistries   Recent Labs Lab 09/15/15 1125  NA 135  K 3.7  CL 103  CO2 21*  GLUCOSE 152*  BUN 24*  CREATININE 0.66  CALCIUM 8.6*   ------------------------------------------------------------------------------------------------------------------  Cardiac Enzymes  Recent Labs Lab 09/15/15 1125  TROPONINI <0.03   ------------------------------------------------------------------------------------------------------------------  RADIOLOGY:  Dg Chest 2 View  09/15/2015  CLINICAL DATA:  Progressive shortness of breath and hypoxia. EXAM: CHEST  2 VIEW COMPARISON:  02/22/2015 FINDINGS: Interstitial and pulmonary vascular prominence present as well as bibasilar opacities. Findings are  suggestive of interstitial edema as well as potentially additional infiltrates in both lower lung zones. Small pleural effusions are suspected. The heart size appears stable with evidence of a prosthetic mitral valve. Bony structures show mild spondylosis of the thoracic and lumbar spine. Atherosclerotic calcifications are visible in the abdominal aorta. IMPRESSION: 1. Findings suggestive of interstitial edema with small bilateral pleural effusions. There may be additional infiltrates in both lower lung zones. 2. Aortic atherosclerosis. Electronically Signed   By: Irish LackGlenn  Yamagata M.D.   On: 09/15/2015 12:30    EKG:   Sinus tachycardia with PVCs  IMPRESSION AND PLAN:   82 year old female with a history of mitral valve replacement and diastolic heart failure who presents with shortness of breath for the past 2 days and found to have pulmonary edema on chest x-ray and pneumonia.  1. Acute hypoxic respiratory failure: This is a combination of CAP and acute on chronic diastolic heart failure. Plan as outlined below. Wean oxygen as tolerated. 2. Acute on chronic diastolic heart failure with mild to moderate mitral valve regurgitation and history of valve replacement: Add IV Lasix. Monitor I's and O's. Cardiology consult for further evaluation and recommendations. Repeat echocardiogram to evaluate function of mitral valve.  3. CAP: Continue Rocephin and azithromycin. Follow up on blood cultures.  4. Anemia: Order an anemia panel. Repeat CBC  in a.m.   All the records are reviewed and case discussed with ED provider. Management plans discussed with the patient and she in agreement  CODE STATUS: FULL  TOTAL TIME TAKING CARE OF THIS PATIENT: 50 minutes.    Aitana Burry M.D on 09/15/2015 at 1:50 PM  Between 7am to 6pm - Pager - [phone]  After 6pm go to [email or web address] - Social research officer, government  [facility] [place] [facility]  Office  [phone]  CC: Primary care physician; Lynnea Ferrier,  MD

## 2015-09-15 NOTE — ED Notes (Signed)
Patient transported to X-ray 

## 2015-09-15 NOTE — Progress Notes (Signed)
Pharmacy Antibiotic Note  Savannah Friedman is a 82 y.o. female admitted on 09/15/2015 with pneumonia.  Pharmacy has been consulted for ceftriaxone and azithromycin dosing.  Plan:  Ceftriaxone 1 g iv q 24 hours and azithromycin 500 mg iv q 24 hours.      Temp (24hrs), Avg:97.6 F (36.4 C), Min:97.6 F (36.4 C), Max:97.6 F (36.4 C)   Recent Labs Lab 09/15/15 1125 09/15/15 1200  WBC 6.4  --   CREATININE 0.66  --   LATICACIDVEN  --  2.8*    CrCl cannot be calculated (Unknown ideal weight.).    Allergies  Allergen Reactions  . Calcitonin ([place])     Other reaction(s): Other (See Comments) Nasal irritation  . Codeine Nausea And Vomiting    Antimicrobials this admission: azithromycin 6/24 >>  ceftriaxone 6/24 >>   Dose adjustments this admission:   Microbiology results: 6/24 BCx: pending UCx: pending   Thank you for allowing pharmacy to be a part of this patient's care.  Luisa HartChristy, Genice Kimberlin D 09/15/2015 2:01 PM

## 2015-09-16 ENCOUNTER — Inpatient Hospital Stay
Admit: 2015-09-16 | Discharge: 2015-09-16 | Disposition: A | Discharge status: Home / Self Care | Payer: Medicare Other | Attending: Internal Medicine | Admitting: Internal Medicine

## 2015-09-16 LAB — ECHOCARDIOGRAM COMPLETE
AO mean calculated velocity dopler: 121 cm/s
AOPV: 0.52 m/s
AOVTI: 30.1 cm
AV Area mean vel: 1.51 cm2
AV Peak grad: 16 mmHg
AV VEL mean LVOT/AV: 0.48
AV area mean vel ind: 1.04 cm2/m2
AVA: 1.54 cm2
AVAREAVTI: 1.64 cm2
AVAREAVTIIND: 1.07 cm2/m2
AVG: 7 mmHg
AVPKVEL: 197 cm/s
CHL CUP AV PEAK INDEX: 1.14
CHL CUP AV VALUE AREA INDEX: 1.07
CHL CUP AV VEL: 1.54
CHL CUP DOP CALC LVOT VTI: 14.8 cm
E decel time: 475 msec
FS: 34 % (ref 28–44)
HEIGHTINCHES: 60 in
IV/PV OW: 1.06
LA diam end sys: 38 mm
LA diam index: 2.63 cm/m2
LA vol: 179 mL
LASIZE: 38 mm
LAVOLA4C: 182 mL
LAVOLIN: 124 mL/m2
LVOT MV VTI INDEX: 0.51 cm2/m2
LVOT MV VTI: 0.73
LVOT SV: 46 mL
LVOT area: 3.14 cm2
LVOT diameter: 20 mm
LVOTPV: 103 cm/s
LVOTVTI: 0.49 cm
MV Annulus VTI: 63.7 cm
MV Dec: 475
MV M vel: 190
MV pk E vel: 220 m/s
MVAP: 1.93 cm2
MVPG: 19 mmHg
MVPKAVEL: 183 m/s
Mean grad: 17 mmHg
P 1/2 time: 114 ms
PW: 10.7 mm — AB (ref 0.6–1.1)
TAPSE: 11.7 mm
WEIGHTICAEL: 1729.6 [oz_av]

## 2015-09-16 LAB — CBC
HCT: 23.9 % — ABNORMAL LOW (ref 35.0–47.0)
Hemoglobin: 7.6 g/dL — ABNORMAL LOW (ref 12.0–16.0)
MCH: 26.1 pg (ref 26.0–34.0)
MCHC: 31.8 g/dL — ABNORMAL LOW (ref 32.0–36.0)
MCV: 82 fL (ref 80.0–100.0)
PLATELETS: 243 10*3/uL (ref 150–440)
RBC: 2.91 MIL/uL — ABNORMAL LOW (ref 3.80–5.20)
RDW: 18.3 % — AB (ref 11.5–14.5)
WBC: 5.3 10*3/uL (ref 3.6–11.0)

## 2015-09-16 LAB — BASIC METABOLIC PANEL
Anion gap: 8 (ref 5–15)
BUN: 16 mg/dL (ref 6–20)
CHLORIDE: 101 mmol/L (ref 101–111)
CO2: 26 mmol/L (ref 22–32)
CREATININE: 0.48 mg/dL (ref 0.44–1.00)
Calcium: 8.1 mg/dL — ABNORMAL LOW (ref 8.9–10.3)
GFR calc Af Amer: >60 mL/min (ref >60)
GFR calc non Af Amer: >60 mL/min (ref >60)
GLUCOSE: 101 mg/dL — AB (ref 65–99)
Potassium: 3.5 mmol/L (ref 3.5–5.1)
Sodium: 135 mmol/L (ref 135–145)

## 2015-09-16 LAB — OCCULT BLOOD X 1 CARD TO LAB, STOOL: Fecal Occult Bld: POSITIVE — AB

## 2015-09-16 LAB — URINE CULTURE

## 2015-09-16 LAB — TROPONIN I: Troponin I: 0.04 ng/mL — ABNORMAL HIGH (ref <0.031)

## 2015-09-16 MED ORDER — ENSURE ENLIVE PO LIQD
237.0000 mL | Freq: Three times a day (TID) | ORAL | Status: DC
  Administered 2015-09-16 – 2015-09-17 (×5): 237 mL via ORAL

## 2015-09-16 MED ORDER — FUROSEMIDE 10 MG/ML IJ SOLN
20.0000 mg | Freq: Once | INTRAMUSCULAR | Status: AC
  Administered 2015-09-16: 20 mg via INTRAVENOUS
  Filled 2015-09-16: qty 2

## 2015-09-16 MED ORDER — FERROUS SULFATE 325 (65 FE) MG PO TABS
325.0000 mg | ORAL_TABLET | Freq: Two times a day (BID) | ORAL | Status: DC
  Administered 2015-09-16 – 2015-09-18 (×4): 325 mg via ORAL
  Filled 2015-09-16 (×4): qty 1

## 2015-09-16 NOTE — Progress Notes (Signed)
Patient ID: Savannah Friedman, female   DOB: October 26, 1923, 82 y.o.   MRN: [id number] [facility] [place] [facility]   PATIENT NAME: Savannah Friedman    MR#:  [id number]  DATE OF BIRTH:  October 26, 1923  SUBJECTIVE:  Came in with increasing sob and cough. Found to have pneumonia and CHF Weak.still sob  REVIEW OF SYSTEMS:   Review of Systems  Constitutional: Positive for malaise/fatigue. Negative for fever, chills and diaphoresis.  HENT: Negative for congestion, ear pain, hearing loss, nosebleeds and sore throat.   Eyes: Negative for blurred vision, double vision, photophobia and pain.  Respiratory: Positive for cough and shortness of breath. Negative for hemoptysis, sputum production, wheezing and stridor.   Cardiovascular: Positive for PND. Negative for orthopnea, claudication and leg swelling.  Gastrointestinal: Negative for heartburn and abdominal pain.  Genitourinary: Negative for dysuria and frequency.  Musculoskeletal: Negative for back pain, joint pain and neck pain.  Skin: Negative for rash.  Neurological: Positive for weakness. Negative for tingling, sensory change, speech change, focal weakness, seizures and headaches.  Endo/Heme/Allergies: Does not bruise/bleed easily.  Psychiatric/Behavioral: Negative for suicidal ideas, memory loss and substance abuse. The patient is not nervous/anxious.   All other systems reviewed and are negative.  Tolerating Diet:some Tolerating PT: pending  DRUG ALLERGIES:   Allergies  Allergen Reactions  . Calcitonin ([place])     Other reaction(s): Other (See Comments) Nasal irritation  . Codeine Nausea And Vomiting    VITALS:  Blood pressure 114/54, pulse 68, temperature 97.8 F (36.6 C), temperature source Axillary, resp. rate 20, height 5' (1.524 m), weight 49.034 kg (108 lb 1.6 oz), SpO2 92 %.  PHYSICAL EXAMINATION:   Physical Exam  GENERAL:  82 y.o.-year-old patient lying in the bed with no acute  distress.thin,cacehctic  EYES: Pupils equal, round, reactive to light and accommodation. No scleral icterus. Extraocular muscles intact.  HEENT: Head atraumatic, normocephalic. Oropharynx and nasopharynx clear.  NECK:  Supple, no jugular venous distention. No thyroid enlargement, no tenderness.  LUNGS: decreasedl breath sounds bilaterally, no wheezing, rales, rhonchi. No use of accessory muscles of respiration.  CARDIOVASCULAR: S1, S2 normal. No murmurs, rubs, or gallops.  ABDOMEN: Soft, nontender, nondistended. Bowel sounds present. No organomegaly or mass.  EXTREMITIES: No cyanosis, clubbing or edema b/l.    NEUROLOGIC: Cranial nerves II through XII are intact. No focal Motor or sensory deficits b/l.   PSYCHIATRIC:  patient is alert and oriented x 3.  SKIN: No obvious rash, lesion, or ulcer.   LABORATORY PANEL:  CBC  Recent Labs Lab 09/16/15 0311  WBC 5.3  HGB 7.6*  HCT 23.9*  PLT 243    Chemistries   Recent Labs Lab 09/16/15 0311  NA 135  K 3.5  CL 101  CO2 26  GLUCOSE 101*  BUN 16  CREATININE 0.48  CALCIUM 8.1*   Cardiac Enzymes  Recent Labs Lab 09/16/15 0311  TROPONINI 0.04*   RADIOLOGY:  Dg Chest 2 View  09/15/2015  CLINICAL DATA:  Progressive shortness of breath and hypoxia. EXAM: CHEST  2 VIEW COMPARISON:  02/22/2015 FINDINGS: Interstitial and pulmonary vascular prominence present as well as bibasilar opacities. Findings are suggestive of interstitial edema as well as potentially additional infiltrates in both lower lung zones. Small pleural effusions are suspected. The heart size appears stable with evidence of a prosthetic mitral valve. Bony structures show mild spondylosis of the thoracic and lumbar spine. Atherosclerotic calcifications are visible in the abdominal aorta. IMPRESSION: 1. Findings suggestive of  interstitial edema with small bilateral pleural effusions. There may be additional infiltrates in both lower lung zones. 2. Aortic atherosclerosis.  Electronically Signed   By: Irish LackGlenn  Yamagata M.D.   On: 09/15/2015 12:30   ASSESSMENT AND PLAN:  82 year old female with a history of mitral valve replacement and diastolic heart failure who presents with shortness of breath for the past 2 days and found to have pulmonary edema on chest x-ray and pneumonia.  1. Acute hypoxic respiratory failure: This is a combination of CAP and acute on chronic diastolic heart failure. Plan as outlined below. Wean oxygen as tolerated. -rocephin and azithromycin  2. Acute on chronic diastolic heart failure with mild to moderate mitral valve regurgitation and history of valve replacement:  IV Lasix 20 mg today. Assess daily need of lasix Monitor I's and O's. Cardiology consult for further evaluation and recommendations. Repeat echocardiogram to evaluate function of mitral valve.  3. CAP: Continue Rocephin and azithromycin. Follow up on blood cultures.  4. Anemia: Order an anemia panel. Repeat CBC in a.m. -hgb 12.6 (nov 2016)--8.6--7.6 -severe IDA -start po iron -transfuse as needed  Case discussed with Care Management/Social Worker. Management plans discussed with the patient, family and they are in agreement.  CODE STATUS:FULL DVT Prophylaxis:lovenox  TOTAL TIME TAKING CARE OF THIS PATIENT: 30 minutes.  >50% time spent on counselling and coordination of care  POSSIBLE D/C IN 1-2 DAYS, DEPENDING ON CLINICAL CONDITION.  Note: This dictation was prepared with Dragon dictation along with smaller phrase technology. Any transcriptional errors that result from this process are unintentional.  Tyshana Nishida M.D on 09/16/2015 at 2:16 PM  Between 7am to 6pm - Pager - [phone]  After 6pm go to [email or web address] - password [email or web address] [facility]  [facility]  Office  [phone]  CC: Primary care physician; Lynnea FerrierBERT J KLEIN III, MD

## 2015-09-16 NOTE — Progress Notes (Signed)
Pt remains alert and oriented. Visitors in. Pt doe with exertion. Heart rate at rest in 110's and up to 140-150's when up to bsc. 02 increased to 3l n.c. Pt had large dark brown stool. Spec sent for occult. Appetite remains poor. However ensure ordered per pt request.

## 2015-09-16 NOTE — Progress Notes (Signed)
PT Cancellation Note  Patient Details Name: Savannah Friedman MRN: [id number] DOB: 1923-09-24   Cancelled Treatment:    Reason Eval/Treat Not Completed: Patient not medically ready (PT spoke with patient at bedside. Pt currently in afib HR ranging between high 110s and 130s at rest. Will hold and attempt evaluation again at later date/time. SaO2 flow rate noted to be 88% on 2L, increased to 3L/min SaO2 @ 92%: RN notified.)    2:57 PM, 09/16/2015 Rosamaria LintsAllan C Teruko Joswick, PT, DPT Physical Therapist - [place] [phone](ASCOM)  [phone] (mobile)

## 2015-09-16 NOTE — Progress Notes (Signed)
Pt c/o generalized pain, primarily in back. Medicated with tylenol. [facility] to void. Pt weak and SOB on exertion. Remains on O2/[place] a 4l continuous. Sats mid 90's. Urine sample collected and sent to lab. Vs stable afebrile. Tele Sinus arrhythmia. SCD's placed on pt.

## 2015-09-16 NOTE — Consult Note (Signed)
[facility]  CARDIOLOGY CONSULT NOTE  Patient ID: Savannah Friedman MRN: [id number] DOB/AGE: 10/02/1923 82 y.o.  Admit date: 09/15/2015 Referring Physician Allena KatzPatel Primary Physician Wynonia MustyKlein Primary Cardiologist Gwen PoundsKowalski Reason for Consultation Congestive heart failure  HPI: 82 year old female referred for evaluation of congestive heart failure. The patient has known valvular heart disease, status post mitral valve repair, chronic atrial fibrillation, and chronic diastolic congestive heart failure. The patient presents with three-day history of fatigue, and shortness of breath. In the emergency room, chest x-ray did reveal evidence for bilateral interstitial edema, with hypoxia, and significant anemia. Patient has modest clinical improvement following initial diuresis.  Review of systems complete and found to be negative unless listed above     Past Medical History  Diagnosis Date  . Anemia   . CHF (congestive heart failure) (HCC)   . PMR (polymyalgia rheumatica) (HCC)   . Atrial fibrillation (HCC)   . CVA (cerebral infarction)   . S/P MVR (mitral valve replacement)   . HLD (hyperlipidemia)   . PVD (peripheral vascular disease) [facility](HCC)     Past Surgical History  Procedure Laterality Date  . Cardiac valve replacement      Prescriptions prior to admission  Medication Sig Dispense Refill Last Dose  . aspirin EC 81 MG EC tablet Take 1 tablet (81 mg total) by mouth daily.   09/14/2015 at Unknown time  . atorvastatin (LIPITOR) 40 MG tablet Take 1 tablet (40 mg total) by mouth daily at 6 PM.   09/14/2015 at Unknown time  . HYDROcodone-acetaminophen (NORCO/VICODIN) 5-325 MG tablet Take 1 tablet by mouth every 6 (six) hours as needed. For pain for up to 10 days.   09/15/2015 at Unknown time  . ipratropium-albuterol (DUONEB) 0.5-2.5 (3) MG/3ML SOLN Take 3 mLs by nebulization 3 (three) times daily. 360 mL  09/15/2015 at Unknown time  . timolol (TIMOPTIC) 0.5 % ophthalmic solution Place 1 drop into  both eyes daily.   09/14/2015 at Unknown time  . acetaminophen (TYLENOL) 325 MG tablet Take 2 tablets (650 mg total) by mouth every 6 (six) hours as needed for mild pain (or Fever >/= 101).     Marland Kitchen. levofloxacin (LEVAQUIN) 250 MG tablet For four more days 4 tablet 0   . predniSONE (DELTASONE) 5 MG tablet 4 tabs day1; 3 tabs day2; 2 tabs day3, 1 tab day4,5 11 tablet 0    Social History   Social History  . Marital Status: Widowed    Spouse Name: N/A  . Number of Children: N/A  . Years of Education: N/A   Occupational History  . Not on file.   Social History Main Topics  . Smoking status: Former Smoker -- 30 years    Types: Cigarettes  . Smokeless tobacco: Not on file  . Alcohol Use: 4.2 oz/week    7 Glasses of wine per week  . Drug Use: Not on file  . Sexual Activity: Not on file   Other Topics Concern  . Not on file   Social History Narrative    No family history on file.    Review of systems complete and found to be negative unless listed above      PHYSICAL EXAM  General: Well developed, well nourished, in no acute distress HEENT:  Normocephalic and atramatic Neck:  No JVD.  Lungs: Clear bilaterally to auscultation and percussion. Heart: HRRR . Normal S1 and S2 without gallops or murmurs.  Abdomen: Bowel sounds are positive, abdomen soft and non-tender  Msk:  Back  normal, normal gait. Normal strength and tone for age. Extremities: No clubbing, cyanosis or edema.   Neuro: Alert and oriented X 3. Psych:  Good affect, responds appropriately  Labs:   Lab Results  Component Value Date   WBC 5.3 09/16/2015   HGB 7.6* 09/16/2015   HCT 23.9* 09/16/2015   MCV 82.0 09/16/2015   PLT 243 09/16/2015    Recent Labs Lab 09/16/15 0311  NA 135  K 3.5  CL 101  CO2 26  BUN 16  CREATININE 0.48  CALCIUM 8.1*  GLUCOSE 101*   Lab Results  Component Value Date   TROPONINI 0.04* 09/16/2015    Lab Results  Component Value Date   CHOL 240* 02/19/2015   Lab Results   Component Value Date   HDL 89 02/19/2015   Lab Results  Component Value Date   LDLCALC 134* 02/19/2015   Lab Results  Component Value Date   TRIG 87 02/19/2015   Lab Results  Component Value Date   CHOLHDL 2.7 02/19/2015   No results found for: LDLDIRECT    Radiology: Dg Chest 2 View  09/15/2015  CLINICAL DATA:  Progressive shortness of breath and hypoxia. EXAM: CHEST  2 VIEW COMPARISON:  02/22/2015 FINDINGS: Interstitial and pulmonary vascular prominence present as well as bibasilar opacities. Findings are suggestive of interstitial edema as well as potentially additional infiltrates in both lower lung zones. Small pleural effusions are suspected. The heart size appears stable with evidence of a prosthetic mitral valve. Bony structures show mild spondylosis of the thoracic and lumbar spine. Atherosclerotic calcifications are visible in the abdominal aorta. IMPRESSION: 1. Findings suggestive of interstitial edema with small bilateral pleural effusions. There may be additional infiltrates in both lower lung zones. 2. Aortic atherosclerosis. Electronically Signed   By: Irish LackGlenn  Yamagata M.D.   On: 09/15/2015 12:30    EKG: Atrial fibrillation  ASSESSMENT AND PLAN:   1. Acute on chronic diastolic congestive heart failure, minimally fluid overloaded, modestly improved after initial diuresis 2. Status post mitral valve repair 3. Atrial fibrillation, currently on aspirin for stroke prevention 4. Significant anemia, unknown etiology, likely contributing to patient's symptoms and hypoxia  Recommendations  1. Agree with current therapy 2. Continue diuresis 3. Carefully monitor renal status 4. Review 2-D echocardiogram 5. Defer chronic anticoagulation, especially in the setting of worsening anemia 6. Consider further workup of anemia   Signed: Zak Gondek MD,PhD, [facility] 09/16/2015, 10:23 AM

## 2015-09-16 NOTE — Progress Notes (Signed)
*  PRELIMINARY RESULTS* Echocardiogram 2D Echocardiogram has been performed.  Cristela BlueHege, Philipe Laswell 09/16/2015, 10:19 AM

## 2015-09-17 MED ORDER — FUROSEMIDE 10 MG/ML IJ SOLN
20.0000 mg | Freq: Once | INTRAMUSCULAR | Status: AC
  Administered 2015-09-17: 20 mg via INTRAVENOUS
  Filled 2015-09-17: qty 2

## 2015-09-17 NOTE — [place] FL2 (Signed)
[place] MEDICAID FL2 LEVEL OF CARE SCREENING TOOL     IDENTIFICATION  Patient Name: Savannah Friedman Birthdate: [birth year] Sex: female Admission Date (Current Location): 09/15/2015  [place]   Facility and Address:  [facility], [address], [place], [place] [address]      Provider Number: [id number]  Attending Physician Name and Address:  Enedina FinnerSona Patel, MD  Relative Name and Phone Number:       Current Level of Care: Hospital Recommended Level of Care: Skilled Nursing Facility Prior Approval Number:    Date Approved/Denied:   PASRR Number:  [id number])  Discharge Plan: SNF    Current Diagnoses: Patient Active Problem List   Diagnosis Date Noted  . CHF exacerbation (HCC) 09/15/2015  . Pneumonia 02/17/2015    Orientation RESPIRATION BLADDER Height & Weight     Self, Time, Situation, Place  O2 (Nasal Cannula 3 (L/min) ) Continent Weight: 108 lb 1.6 oz (49.034 kg) Height:  5' (152.4 cm)  BEHAVIORAL SYMPTOMS/MOOD NEUROLOGICAL BOWEL NUTRITION STATUS   (None)  (None) Continent Diet (Heart)  AMBULATORY STATUS COMMUNICATION OF NEEDS Skin   Extensive Assist Verbally Normal                       Personal Care Assistance Level of Assistance  Bathing, Feeding, Dressing Bathing Assistance: Limited assistance Feeding assistance: Independent Dressing Assistance: Limited assistance     Functional Limitations Info  Sight, Hearing, Speech Sight Info: Adequate Hearing Info: Adequate Speech Info: Adequate    SPECIAL CARE FACTORS FREQUENCY  PT (By licensed PT)     PT Frequency:  (5)              Contractures      Additional Factors Info  Code Status, Allergies Code Status Info:  (Full Code) Allergies Info:  (Calcitonin (Salmon), Codeine)           Current Medications (09/17/2015):  This is the current hospital active medication list Current Facility-Administered Medications  Medication Dose  Route Frequency Provider Last Rate Last Dose  . acetaminophen (TYLENOL) tablet 650 mg  650 mg Oral Q6H PRN Adrian SaranSital Mody, MD   650 mg at 09/16/15 1231   Or  . acetaminophen (TYLENOL) suppository 650 mg  650 mg Rectal Q6H PRN Adrian SaranSital Mody, MD      . aspirin EC tablet 81 mg  81 mg Oral QHS Arnaldo NatalMichael S Diamond, MD   81 mg at 09/16/15 2124  . azithromycin (ZITHROMAX) tablet 500 mg  500 mg Oral Daily Adrian SaranSital Mody, MD   500 mg at 09/17/15 0948  . cefTRIAXone (ROCEPHIN) 1 g in dextrose 5 % 50 mL IVPB  1 g Intravenous Q24H Adrian SaranSital Mody, MD   1 g at 09/17/15 0948  . feeding supplement (ENSURE ENLIVE) (ENSURE ENLIVE) liquid 237 mL  237 mL Oral TID BM Enedina FinnerSona Patel, MD   237 mL at 09/17/15 1314  . ferrous sulfate tablet 325 mg  325 mg Oral BID WC Enedina FinnerSona Patel, MD   325 mg at 09/17/15 0900  . ondansetron (ZOFRAN) tablet 4 mg  4 mg Oral Q6H PRN Adrian SaranSital Mody, MD       Or  . ondansetron (ZOFRAN) injection 4 mg  4 mg Intravenous Q6H PRN Sital Mody, MD      . polyethylene glycol (MIRALAX / GLYCOLAX) packet 17 g  17 g Oral Daily Adrian SaranSital Mody, MD   17 g at 09/16/15 1033  . senna-docusate (Senokot-S)  tablet 1 tablet  1 tablet Oral QHS PRN Adrian SaranSital Mody, MD   1 tablet at 09/17/15 0948  . sodium chloride flush (NS) 0.9 % injection 3 mL  3 mL Intravenous Q12H Adrian SaranSital Mody, MD   3 mL at 09/17/15 0950     Discharge Medications: Please see discharge summary for a list of discharge medications.  Relevant Imaging Results:  Relevant Lab Results:   Additional Information  (SSN [id number])  [name], LCSW

## 2015-09-17 NOTE — Progress Notes (Signed)
Patient ID: Savannah Friedman, female   DOB: 11/17/1923, 82 y.o.   MRN: [id number] [facility] [place] [facility]   PATIENT NAME: Savannah Friedman    MR#:  [id number]  DATE OF BIRTH:  11/17/1923  SUBJECTIVE:  Came in with increasing sob and cough. Found to have pneumonia and CHF Weak. Out to the chair feeling much better today REVIEW OF SYSTEMS:   Review of Systems  Constitutional: Positive for malaise/fatigue. Negative for fever, chills and diaphoresis.  HENT: Negative for congestion, ear pain, hearing loss, nosebleeds and sore throat.   Eyes: Negative for blurred vision, double vision, photophobia and pain.  Respiratory: Positive for cough and shortness of breath. Negative for hemoptysis, sputum production, wheezing and stridor.   Cardiovascular: Negative for orthopnea, claudication and leg swelling.  Gastrointestinal: Negative for heartburn and abdominal pain.  Genitourinary: Negative for dysuria and frequency.  Musculoskeletal: Negative for back pain, joint pain and neck pain.  Skin: Negative for rash.  Neurological: Positive for weakness. Negative for tingling, sensory change, speech change, focal weakness, seizures and headaches.  Endo/Heme/Allergies: Does not bruise/bleed easily.  Psychiatric/Behavioral: Negative for suicidal ideas, memory loss and substance abuse. The patient is not nervous/anxious.   All other systems reviewed and are negative.  Tolerating Diet:some Tolerating PT: pending  DRUG ALLERGIES:   Allergies  Allergen Reactions  . Calcitonin (Salmon)     Other reaction(s): Other (See Comments) Nasal irritation  . Codeine Nausea And Vomiting    VITALS:  Blood pressure 111/56, pulse 105, temperature 97.7 F (36.5 C), temperature source Oral, resp. rate 16, height 5' (1.524 m), weight 49.034 kg (108 lb 1.6 oz), SpO2 99 %.  PHYSICAL EXAMINATION:   Physical Exam  GENERAL:  82 y.o.-year-old patient lying in the bed with no acute  distress.thin,cacehctic  EYES: Pupils equal, round, reactive to light and accommodation. No scleral icterus. Extraocular muscles intact.  HEENT: Head atraumatic, normocephalic. Oropharynx and nasopharynx clear.  NECK:  Supple, no jugular venous distention. No thyroid enlargement, no tenderness.  LUNGS: decreasedl breath sounds bilaterally, no wheezing, rhonchi. No use of accessory muscles of respiration.  CARDIOCrackles plusVASCULAR: S1, S2 normal. No murmurs, rubs, or gallops.  ABDOMEN: Soft, nontender, nondistended. Bowel sounds present. No organomegaly or mass.  EXTREMITIES: No cyanosis, clubbing or edema b/l.    NEUROLOGIC: Cranial nerves II through XII are intact. No focal Motor or sensory deficits b/l.   PSYCHIATRIC:  patient is alert and oriented x 3.  SKIN: No obvious rash, lesion, or ulcer.   LABORATORY PANEL:  CBC  Recent Labs Lab 09/16/15 0311  WBC 5.3  HGB 7.6*  HCT 23.9*  PLT 243    Chemistries   Recent Labs Lab 09/16/15 0311  NA 135  K 3.5  CL 101  CO2 26  GLUCOSE 101*  BUN 16  CREATININE 0.48  CALCIUM 8.1*   Cardiac Enzymes  Recent Labs Lab 09/16/15 0311  TROPONINI 0.04*   RADIOLOGY:  No results found. ASSESSMENT AND PLAN:  82 year old female with a history of mitral valve replacement and diastolic heart failure who presents with shortness of breath for the past 2 days and found to have pulmonary edema on chest x-ray and pneumonia.  1. Acute hypoxic respiratory failure: This is a combination of CAP and acute on chronic diastolic heart failure. Plan as outlined below. Wean oxygen as tolerated. -rocephin and azithromycin  2. Acute on chronic diastolic heart failure with mild to moderate mitral valve regurgitation and history of valve  replacement:  IV Lasix 20 mg today. Assess daily need of lasix Monitor I's and O's. Cardiology consult for further evaluation and recommendations. Repeat echocardiogram  Appears stable.  3. CAP: Continue Rocephin  and azithromycin.  Negative blood cultures   4. Anemia: Order an anemia panel. Repeat CBC in a.m. -hgb 12.6 (nov 2016)--8.6--7.6 -severe IDA -start po iron We'll consider getting a Hematology appointment at discharge for follow-up of anemia -Transfuse as needed  Discussed with patient and patient's family  Case discussed with Care Management/Social Worker. Management plans discussed with the patient, family and they are in agreement.  CODE STATUS:FULL DVT Prophylaxis:lovenox  TOTAL TIME TAKING CARE OF THIS PATIENT: 30 minutes.  >50% time spent on counselling and coordination of care  POSSIBLE D/C IN 1-2 DAYS, DEPENDING ON CLINICAL CONDITION.  Note: This dictation was prepared with Dragon dictation along with smaller phrase technology. Any transcriptional errors that result from this process are unintentional.  Shiza Thelen M.D on 09/17/2015 at 2:52 PM  Between 7am to 6pm - Pager - [phone]  After 6pm go to [email or web address] - password [email or web address] [facility]  [facility]  Office  [phone]  CC: Primary care physician; Lynnea FerrierBERT J KLEIN III, MD

## 2015-09-17 NOTE — Evaluation (Signed)
Physical Therapy Evaluation Patient Details Name: Savannah Friedman MRN: [id number] DOB: 08-05-1923 Today's Date: 09/17/2015   History of Present Illness  Savannah Friedman  is a 82 y.o. female with a known history of diastolic heart failure with last ECHO. In November 2016 with normal ejection fraction and mild to moderate MR and atrial fibrillation who presents with shortness of breath for the past 2 days. Patient denies cough, fever, chills or lower extremity edema. She denies chest pain or pressure. Chest x-ray in the emergency room shows pulmonary edema and bilateral infiltrates. She was found to have oxygen saturation of 80% on room air. She does not wear oxygen at home. Pt reports 2 falls in the last 12 months.   Clinical Impression  Pt is very deconditioned and weak during physical therapy evaluation. She becomes very SOB with exertion with SaO2 dropping to low/mid 80's during bed mobility. Appears to remain around 90% with transfer from bed to recliner. HR increases from 110 bpm at rest to 125-130 during bed mobility and short ambulation to recliner. Unable to safely ambulate further at this time due to unstable vital signs. Pt will need SNF placement at discharge in order to return to prior level of function at home. Pt will benefit from skilled PT services to address deficits in strength, balance, and mobility in order to return to full function at home.     Follow Up Recommendations SNF    Equipment Recommendations  None recommended by PT    Recommendations for Other Services       Precautions / Restrictions Precautions Precautions: Fall Restrictions Weight Bearing Restrictions: No      Mobility  Bed Mobility Overal bed mobility: Needs Assistance Bed Mobility: Supine to Sit     Supine to sit: Mod assist     General bed mobility comments: Pt requiring assist to come from recumbent to sitting. HOB elevated and bed rails utilized. During bed mobility SaO2 drops to 85% and HR  increases to 130 bpm. Pt takes 60-90 seconds for HR to recover to 100 bpm and SaO2 to recover to 90%  Transfers Overall transfer level: Needs assistance Equipment used: Rolling walker (2 wheeled) Transfers: Sit to/from Stand Sit to Stand: Min guard         General transfer comment: Pt requires increased time to come to standing due to LE weakness. She is unsteady due to LE weakness but able to self correct with UE support  Ambulation/Gait Ambulation/Gait assistance: Min guard Ambulation Distance (Feet): 3 Feet Assistive device: Rolling walker (2 wheeled)   Gait velocity: Decreased Gait velocity interpretation: Below normal speed for age/gender General Gait Details: Pt able to take short shuffling steps to get from bed to recliner. CGA provided but pt able to stabilize with rolling walker. Deconditioning noted with considerable DOE with very short ambulation on supplemental O2. HR increases to 125 bpm. SaO2 appears to remain around 90% on supplemental O2. Unsafe to continue further ambulation at this time  Information systems managertairs            Wheelchair Mobility    Modified Rankin (Stroke Patients Only)       Balance Overall balance assessment: Needs assistance Sitting-balance support: No upper extremity supported Sitting balance-Leahy Scale: Fair     Standing balance support: Bilateral upper extremity supported Standing balance-Leahy Scale: Fair  Pertinent Vitals/Pain Pain Assessment: No/denies pain    Home Living Family/patient expects to be discharged to:: Private residence Living Arrangements: Children;Other (Comment) (Son and DIL) Available Help at Discharge: Family;Available PRN/intermittently Type of Home: House Home Access: Stairs to enter Entrance Stairs-Rails: Doctor, general practiceight;Left Entrance Stairs-Number of Steps: 2 Home Layout: One level Home Equipment: Walker - 2 wheels;Cane - single point      Prior Function Level of Independence:  Independent with assistive device(s)         Comments: Limited community ambulator, driving until 2 months ago, independent with ADLs. Assist with IADLs.     Hand Dominance   Dominant Hand: Right    Extremity/Trunk Assessment   Upper Extremity Assessment: Generalized weakness           Lower Extremity Assessment: Generalized weakness         Communication   Communication: No difficulties  Cognition Arousal/Alertness: Awake/alert Behavior During Therapy: WFL for tasks assessed/performed Overall Cognitive Status: Within Functional Limits for tasks assessed                      General Comments      Exercises        Assessment/Plan    PT Assessment Patient needs continued PT services  PT Diagnosis Difficulty walking;Abnormality of gait;Generalized weakness   PT Problem List Decreased strength;Decreased activity tolerance;Decreased balance;Decreased mobility;Cardiopulmonary status limiting activity  PT Treatment Interventions DME instruction;Gait training;Stair training;Therapeutic activities;Functional mobility training;Therapeutic exercise;Balance training;Neuromuscular re-education;Patient/family education   PT Goals (Current goals can be found in the Care Plan section) Acute Rehab PT Goals Patient Stated Goal: Return to prior level of function PT Goal Formulation: With patient Time For Goal Achievement: 10/01/15 Potential to Achieve Goals: Fair    Frequency Min 2X/week   Barriers to discharge        Co-evaluation               End of Session Equipment Utilized During Treatment: Gait belt Activity Tolerance: Treatment limited secondary to medical complications (Comment) Patient left: in chair;with call bell/phone within reach;with chair alarm set Nurse Communication: Mobility status         Time: 1115-1140 PT Time Calculation (min) (ACUTE ONLY): 25 min   Charges:   PT Evaluation $PT Eval Moderate Complexity: 1 Procedure      PT G Codes:       Sharalyn InkJason D Huprich PT, DPT   Huprich,Jason 09/17/2015, 12:05 PM

## 2015-09-17 NOTE — Clinical Social Work Note (Signed)
Clinical Social Work Assessment  Patient Details  Name: Savannah Friedman MRN: [id number] Date of Birth: [birth year]  Date of referral:  09/17/15               Reason for consult:  Discharge Planning                Permission sought to share information with:  Family Supports Permission granted to share information::  Yes, Verbal Permission Granted  Name::        Agency::     Relationship::    [name]- Daughter)  Sport and exercise psychologist Information:     Housing/Transportation Living arrangements for the past 2 months:  Single Family Home Source of Information:  Patient Patient Interpreter Needed:  None Criminal Activity/Legal Involvement Pertinent to Current Situation/Hospitalization:  No - Comment as needed Significant Relationships:  Adult Children, Other Family Members Lives with:  Self Do you feel safe going back to the place where you live?  Yes Need for family participation in patient care:  No (Coment)  Care giving concerns:  Patient and her family are in agreement that she'll benefit from SNF placement at discharge.    Social Worker assessment / plan:  CSW met with patient at bedside. Introduced herself and her role. Verbal permission granted to discuss discharge plans in front of family in the room. Per patient she would like to go to SNF at discharge. Stated that she's been to [facility] in the past. Reports that she's willing to go but she does not want to stay long-term. Granted CSW verbal permission to send SNF referral to SNFs in [facility] with preference [place]. Call to [facility] admissions to request that patient's referral is reviewed. FL2/ PASRR completed and placed in MDs basket for signature. Awaiting bed offers. CSW will continue to follow and assist.   Employment status:  Retired Nurse, adult PT Recommendations:  [employer] / Referral to community resources:  [place]  Patient/Family's Response to care:   Patient and her family are in agreement that patient would benefit from SNF placement at discharge.   Patient/Family's Understanding of and Emotional Response to Diagnosis, Current Treatment, and Prognosis: Patient and her family are appreciative of CSW's assistance. Reported they understand why patient has been admitted into [facility].   Emotional Assessment Appearance:  Appears stated age Attitude/Demeanor/Rapport:   (None) Affect (typically observed):  Calm, Pleasant Orientation:  Oriented to Self, Oriented to Place, Oriented to  Time, Oriented to Situation Alcohol / Substance use:  Not Applicable Psych involvement (Current and /or in the community):  No (Comment)  Discharge Needs  Concerns to be addressed:  Discharge Planning Concerns Readmission within the last 30 days:  No Current discharge risk:  Chronically ill Barriers to Discharge:  Continued Medical Work up   [employer], LCSW 09/17/2015, 4:07 PM

## 2015-09-17 NOTE — Consult Note (Signed)
[facility]  SUBJECTIVE: I don't think I'm getting out of here   Filed Vitals:   09/16/15 1151 09/16/15 1458 09/16/15 1952 09/17/15 0449  BP: 114/54  110/58 95/50  Pulse: 68 125 117 102  Temp: 97.8 F (36.6 C)  97.7 F (36.5 C) 97.5 F (36.4 C)  TempSrc: Axillary  Oral Oral  Resp: 20  16 16   Height:      Weight:      SpO2: 92% 88% 95% 98%     Intake/Output Summary (Last 24 hours) at 09/17/15 [id number] Last data filed at 09/17/15 0731  Gross per 24 hour  Intake    120 ml  Output    776 ml  Net   -656 ml      PHYSICAL EXAM  General: Well developed, well nourished, in no acute distress HEENT:  Normocephalic and atramatic Neck:  No JVD.  Lungs: Clear bilaterally to auscultation and percussion. Heart: HRRR . Normal S1 and S2 without gallops or murmurs.  Abdomen: Bowel sounds are positive, abdomen soft and non-tender  Msk:  Back normal, normal gait. Normal strength and tone for age. Extremities: No clubbing, cyanosis or edema.   Neuro: Alert and oriented X 3. Psych:  Good affect, responds appropriately   LABS: Basic Metabolic Panel:  Recent Labs  62/13/0806/24/17 1125 09/16/15 0311  NA 135 135  K 3.7 3.5  CL 103 101  CO2 21* 26  GLUCOSE 152* 101*  BUN 24* 16  CREATININE 0.66 0.48  CALCIUM 8.6* 8.1*   Liver Function Tests: No results for input(s): AST, ALT, ALKPHOS, BILITOT, PROT, ALBUMIN in the last 72 hours. No results for input(s): LIPASE, AMYLASE in the last 72 hours. CBC:  Recent Labs  09/15/15 1125 09/16/15 0311  WBC 6.4 5.3  NEUTROABS 5.6  --   HGB 8.6* 7.6*  HCT 26.8* 23.9*  MCV 82.8 82.0  PLT 313 243   Cardiac Enzymes:  Recent Labs  09/15/15 1443 09/15/15 2039 09/16/15 0311  TROPONINI <0.03 0.04* 0.04*   BNP: Invalid input(s): POCBNP D-Dimer: No results for input(s): DDIMER in the last 72 hours. Hemoglobin A1C: No results for input(s): HGBA1C in the last 72 hours. Fasting Lipid Panel: No results for input(s): CHOL, HDL, LDLCALC,  TRIG, CHOLHDL, LDLDIRECT in the last 72 hours. Thyroid Function Tests:  Recent Labs  09/15/15 1443  TSH 1.817   Anemia Panel:  Recent Labs  09/15/15 1443  VITAMINB12 1337*  FOLATE 17.5  FERRITIN 14  TIBC 453*  IRON 10*  RETICCTPCT 5.0*    Dg Chest 2 View  09/15/2015  CLINICAL DATA:  Progressive shortness of breath and hypoxia. EXAM: CHEST  2 VIEW COMPARISON:  02/22/2015 FINDINGS: Interstitial and pulmonary vascular prominence present as well as bibasilar opacities. Findings are suggestive of interstitial edema as well as potentially additional infiltrates in both lower lung zones. Small pleural effusions are suspected. The heart size appears stable with evidence of a prosthetic mitral valve. Bony structures show mild spondylosis of the thoracic and lumbar spine. Atherosclerotic calcifications are visible in the abdominal aorta. IMPRESSION: 1. Findings suggestive of interstitial edema with small bilateral pleural effusions. There may be additional infiltrates in both lower lung zones. 2. Aortic atherosclerosis. Electronically Signed   By: Irish LackGlenn  Yamagata M.D.   On: 09/15/2015 12:30     Echo Normal LV function, LVEF 55-65%, stable mitral valve bioprosthesis, although moderate aortic stenosis  TELEMETRY: Atrial fibrillation:  ASSESSMENT AND PLAN:  Active Problems:   CHF exacerbation (HCC)  1. Respiratory failure, likely multifactorial, secondary to upper respiratory infection/bronchitis, and acute on chronic diastolic congestive heart failure, slowly improving 2. Acute on chronic diastolic congestive heart failure, improved after initial diuresis, normal left ventricular function by 2-D echocardiogram 3. Atrial fibrillation, rate controlled, currently on aspirin for stroke prevention. Patient would be a candidate for chronic anticoagulation, however patient currently anemic. 4. Significant anemia, likely contributing to patient's shortness of breath and hypoxia, of unknown  etiology  Recommendations  1. Continue current therapy 2. Continue diuresis 3. Closely monitor renal status 4. Defer chronic anticoagulation in light of patient's worsening anemia  Sign off for now, please call if any questions   Elridge Stemm, MD, PhD, [facility] 09/17/2015 8:24 AM

## 2015-09-17 NOTE — Care Management Important Message (Signed)
Important Message  Patient Details  Name: Savannah Friedman MRN: [id number] Date of Birth: 11-20-1923   Medicare Important Message Given:  Yes    Marily MemosLisa M Ayumi Wangerin, RN 09/17/2015, 11:13 AM

## 2015-09-18 ENCOUNTER — Encounter
Admission: RE | Admit: 2015-09-18 | Discharge: 2015-09-18 | Disposition: A | Discharge status: Home / Self Care | Payer: Medicare Other | Source: Ambulatory Visit | Attending: Internal Medicine | Admitting: Internal Medicine

## 2015-09-18 DIAGNOSIS — D649 Anemia, unspecified: Secondary | ICD-10-CM | POA: Insufficient documentation

## 2015-09-18 LAB — CBC
HCT: 24.9 % — ABNORMAL LOW (ref 35.0–47.0)
HEMOGLOBIN: 7.9 g/dL — AB (ref 12.0–16.0)
MCH: 25.9 pg — AB (ref 26.0–34.0)
MCHC: 31.7 g/dL — AB (ref 32.0–36.0)
MCV: 81.6 fL (ref 80.0–100.0)
PLATELETS: 231 10*3/uL (ref 150–440)
RBC: 3.06 MIL/uL — ABNORMAL LOW (ref 3.80–5.20)
RDW: 17.6 % — AB (ref 11.5–14.5)
WBC: 4 10*3/uL (ref 3.6–11.0)

## 2015-09-18 MED ORDER — AZITHROMYCIN 250 MG PO TABS: ORAL_TABLET | ORAL | Status: AC

## 2015-09-18 MED ORDER — PANTOPRAZOLE SODIUM 40 MG PO TBEC: 40.0000 mg | DELAYED_RELEASE_TABLET | Freq: Every day | ORAL | Status: AC

## 2015-09-18 MED ORDER — CEFUROXIME AXETIL 500 MG PO TABS: 500.0000 mg | ORAL_TABLET | Freq: Two times a day (BID) | ORAL | Status: AC

## 2015-09-18 MED ORDER — FUROSEMIDE 20 MG PO TABS: 20.0000 mg | ORAL_TABLET | ORAL | Status: DC

## 2015-09-18 MED ORDER — AZITHROMYCIN 250 MG PO TABS
250.0000 mg | ORAL_TABLET | Freq: Every day | ORAL | Status: DC
  Administered 2015-09-18: 250 mg via ORAL
  Filled 2015-09-18: qty 1

## 2015-09-18 MED ORDER — CEFUROXIME AXETIL 500 MG PO TABS
500.0000 mg | ORAL_TABLET | Freq: Two times a day (BID) | ORAL | Status: DC
  Administered 2015-09-18: 500 mg via ORAL
  Filled 2015-09-18: qty 1

## 2015-09-18 MED ORDER — FUROSEMIDE 20 MG PO TABS: 20.0000 mg | ORAL_TABLET | ORAL | Status: AC

## 2015-09-18 MED ORDER — FERROUS SULFATE 325 (65 FE) MG PO TABS: 325.0000 mg | ORAL_TABLET | Freq: Two times a day (BID) | ORAL | Status: AC

## 2015-09-18 MED ORDER — ENSURE ENLIVE PO LIQD: 237.0000 mL | Freq: Three times a day (TID) | ORAL | Status: AC

## 2015-09-18 MED ORDER — PANTOPRAZOLE SODIUM 40 MG PO TBEC
40.0000 mg | DELAYED_RELEASE_TABLET | Freq: Every day | ORAL | Status: DC
  Administered 2015-09-18: 40 mg via ORAL
  Filled 2015-09-18: qty 1

## 2015-09-18 NOTE — Progress Notes (Signed)
Sp[facility] with Thurston HoleAnne, [facility] at [phone], to notify of non-emergent EMS transport.  Auth notification reference given as [id number].   Service date range good from 09/18/15 - 12/17/15.   Gap exception requested to determine if services can be considered at an in-network level.

## 2015-09-18 NOTE — Discharge Instructions (Signed)
Heart Failure Clinic appointment on October 01, 2015 at 11:30am with Clarisa Kindredina Reno Clasby, FNP. Please call [phone] to reschedule.   Use oxygen to keep sats>92% Duo nebs as needed

## 2015-09-18 NOTE — Discharge Summary (Signed)
[facility] [place] [facility]   PATIENT NAME: Savannah Friedman    MR#:  [id number]  DATE OF BIRTH:  06/03/1923  DATE OF ADMISSION:  09/15/2015 ADMITTING PHYSICIAN: Adrian SaranSital Mody, MD  DATE OF DISCHARGE: 09/18/15  PRIMARY CARE PHYSICIAN: Curtis SitesBERT J KLEIN III, MD    ADMISSION DIAGNOSIS:  Shortness of breath [R06.02] Community acquired pneumonia [J18.9] Hypoxia [R09.02]  DISCHARGE DIAGNOSIS:  Acute hypoxic Respiratory failure due to Pneumonia and acute on chronic diastolic CHF Chronic afib  Anemia(will need outpt w/u) SECONDARY DIAGNOSIS:   Past Medical History  Diagnosis Date  . Anemia   . CHF (congestive heart failure) (HCC)   . PMR (polymyalgia rheumatica) (HCC)   . Atrial fibrillation (HCC)   . CVA (cerebral infarction)   . S/P MVR (mitral valve replacement)   . HLD (hyperlipidemia)   . PVD (peripheral vascular disease) [facility](HCC)     HOSPITAL COURSE:  82 year old female with a history of mitral valve replacement and diastolic heart failure who presents with shortness of breath for the past 2 days and found to have pulmonary edema on chest x-ray and pneumonia.  1. Acute hypoxic respiratory failure: This is a combination of CAP and acute on chronic diastolic heart failure. Plan as outlined below. Wean oxygen as tolerated. -rocephin and azithromycin---change to oral antibiotics  2. Acute on chronic diastolic heart failure with mild to moderate mitral valve regurgitation and history of valve replacement: Received IV Lasix 20 mg. Change to QOD lasix Good UOP Cardiology consult for further evaluation and recommendations appreciated Repeat echocardiogram Appears stable.  3. CAP:  -was on Rocephin and azithromycin. Change to oral abxs  Negative blood cultures   4. Anemia: Order an anemia panel. Repeat CBC in a.m. -hgb 12.6 (nov 2016)--8.6--7.6--7.9 -severe IDA -cont po iron We'll consider getting a Hematology appointment at discharge for  follow-up of anemia -hemoccult positive -will give ppi  Discussed with patient and patient's family (sone Fayrene FearingJames ) Agreeable to go to rehab D/c when bed available  CONSULTS OBTAINED:  Treatment Team:  Marcina MillardAlexander Paraschos, MD  DRUG ALLERGIES:   Allergies  Allergen Reactions  . Calcitonin ([place])     Other reaction(s): Other (See Comments) Nasal irritation  . Codeine Nausea And Vomiting    DISCHARGE MEDICATIONS:   Current Discharge Medication List    START taking these medications   Details  azithromycin (ZITHROMAX) 250 MG tablet Take as directed Qty: 4 each, Refills: 0    cefUROXime (CEFTIN) 500 MG tablet Take 1 tablet (500 mg total) by mouth 2 (two) times daily with a meal. Qty: 8 tablet, Refills: 0    feeding supplement, ENSURE ENLIVE, (ENSURE ENLIVE) LIQD Take 237 mLs by mouth 3 (three) times daily between meals. Qty: 237 mL, Refills: 12    ferrous sulfate 325 (65 FE) MG tablet Take 1 tablet (325 mg total) by mouth 2 (two) times daily with a meal. Qty: 60 tablet, Refills: 3    furosemide (LASIX) 20 MG tablet Take 1 tablet (20 mg total) by mouth every other day. Qty: 30 tablet, Refills: 30    pantoprazole (PROTONIX) 40 MG tablet Take 1 tablet (40 mg total) by mouth daily. Qty: 30 tablet, Refills: 0      CONTINUE these medications which have NOT CHANGED   Details  aspirin EC 81 MG EC tablet Take 1 tablet (81 mg total) by mouth daily.    atorvastatin (LIPITOR) 40 MG tablet Take 1 tablet (40 mg total) by mouth daily at  6 PM.    HYDROcodone-acetaminophen (NORCO/VICODIN) 5-325 MG tablet Take 1 tablet by mouth every 6 (six) hours as needed. For pain for up to 10 days.    ipratropium-albuterol (DUONEB) 0.5-2.5 (3) MG/3ML SOLN Take 3 mLs by nebulization 3 (three) times daily. Qty: 360 mL    timolol (TIMOPTIC) 0.5 % ophthalmic solution Place 1 drop into both eyes daily.      STOP taking these medications     acetaminophen (TYLENOL) 325 MG tablet       levofloxacin (LEVAQUIN) 250 MG tablet      predniSONE (DELTASONE) 5 MG tablet         If you experience worsening of your admission symptoms, develop shortness of breath, life threatening emergency, suicidal or homicidal thoughts you must seek medical attention immediately by calling 911 or calling your MD immediately  if symptoms less severe.  You Must read complete instructions/literature along with all the possible adverse reactions/side effects for all the Medicines you take and that have been prescribed to you. Take any new Medicines after you have completely understood and accept all the possible adverse reactions/side effects.   Please note  You were cared for by a hospitalist during your hospital stay. If you have any questions about your discharge medications or the care you received while you were in the hospital after you are discharged, you can call the unit and asked to speak with the hospitalist on call if the hospitalist that took care of you is not available. Once you are discharged, your primary care physician will handle any further medical issues. Please note that NO REFILLS for any discharge medications will be authorized once you are discharged, as it is imperative that you return to your primary care physician (or establish a relationship with a primary care physician if you do not have one) for your aftercare needs so that they can reassess your need for medications and monitor your lab values. Today   SUBJECTIVE  No new complaints   VITAL SIGNS:  Blood pressure 121/54, pulse 96, temperature 97.5 F (36.4 C), temperature source Oral, resp. rate 18, height 5' (1.524 m), weight 49.034 kg (108 lb 1.6 oz), SpO2 97 %.  I/O:    Intake/Output Summary (Last 24 hours) at 09/18/15 0735 Last data filed at 09/18/15 0733  Gross per 24 hour  Intake    360 ml  Output   1051 ml  Net   -691 ml    PHYSICAL EXAMINATION:  GENERAL:  82 y.o.-year-old patient lying in the bed with  no acute distress.  EYES: Pupils equal, round, reactive to light and accommodation. No scleral icterus. Extraocular muscles intact.  HEENT: Head atraumatic, normocephalic. Oropharynx and nasopharynx clear.  NECK:  Supple, no jugular venous distention. No thyroid enlargement, no tenderness.  LUNGS: Normal breath sounds bilaterally, no wheezing, rales,rhonchi or crepitation. No use of accessory muscles of respiration.  CARDIOVASCULAR: S1, S2 normal. No murmurs, rubs, or gallops. irregular ABDOMEN: Soft, non-tender, non-distended. Bowel sounds present. No organomegaly or mass.  EXTREMITIES: No pedal edema, cyanosis, or clubbing.  NEUROLOGIC: Cranial nerves II through XII are intact. Muscle strength 5/5 in all extremities. Sensation intact. Gait not checked.  PSYCHIATRIC: The patient is alert and oriented x 3.  SKIN: No obvious rash, lesion, or ulcer.   DATA REVIEW:   CBC   Recent Labs Lab 09/18/15 0637  WBC 4.0  HGB 7.9*  HCT 24.9*  PLT 231    Chemistries   Recent Labs Lab 09/16/15  0311  NA 135  K 3.5  CL 101  CO2 26  GLUCOSE 101*  BUN 16  CREATININE 0.48  CALCIUM 8.1*    Microbiology Results   Recent Results (from the past 240 hour(s))  Blood Culture (routine x 2)     Status: None (Preliminary result)   Collection Time: 09/15/15  1:20 PM  Result Value Ref Range Status   Specimen Description BLOOD LEFT ASSIST CONTROL  Final   Special Requests   Final    BOTTLES DRAWN AEROBIC AND ANAEROBIC  AEROBIC 3CC, ANAEROBIC 0.5CC   Culture NO GROWTH 3 DAYS  Final   Report Status PENDING  Incomplete  Blood Culture (routine x 2)     Status: None (Preliminary result)   Collection Time: 09/15/15  1:25 PM  Result Value Ref Range Status   Specimen Description BLOOD RIGHT ASSIST CONTROL  Final   Special Requests BOTTLES DRAWN AEROBIC AND ANAEROBIC  1CC  Final   Culture NO GROWTH 3 DAYS  Final   Report Status PENDING  Incomplete  Urine culture     Status: Abnormal   Collection  Time: 09/15/15  2:29 PM  Result Value Ref Range Status   Specimen Description URINE, RANDOM  Final   Special Requests NONE  Final   Culture MULTIPLE SPECIES PRESENT, SUGGEST RECOLLECTION (A)  Final   Report Status 09/16/2015 FINAL  Final    RADIOLOGY:  No results found.   Management plans discussed with the patient, family and they are in agreement.  CODE STATUS:     Code Status Orders        Start     Ordered   09/15/15 1433  Full code   Continuous     09/15/15 1432    Code Status History    Date Active Date Inactive Code Status Order ID Comments User Context   02/17/2015  3:44 PM 02/23/2015  6:39 PM Full Code [id number]  Adrian SaranSital Mody, MD Inpatient      TOTAL TIME TAKING CARE OF THIS PATIENT: 40 minutes.    Lamyah Creed M.D on 09/18/2015 at 7:35 AM  Between 7am to 6pm - Pager - [phone] After 6pm go to [email or web address] - password [email or web address] [facility]  [facility]  Office  [phone]  CC: Primary care physician; Lynnea FerrierBERT J KLEIN III, MD

## 2015-09-18 NOTE — Progress Notes (Signed)
[facility]

## 2015-09-18 NOTE — Clinical Social Work Placement (Signed)
   CLINICAL SOCIAL WORK PLACEMENT  NOTE  Date:  09/18/2015  Patient Details  Name: Savannah Friedman MRN: [id number] Date of Birth: 11-02-1923  Clinical Social Work is seeking post-discharge placement for this patient at the Skilled  Nursing Facility level of care (*CSW will initial, date and re-position this form in  chart as items are completed):  No   Patient/family provided with [facility] Clinical Social Work Department's list of facilities offering this level of care within the geographic area requested by the patient (or if unable, by the patient's family).  No   Patient/family informed of their freedom to choose among providers that offer the needed level of care, that participate in Medicare, Medicaid or managed care program needed by the patient, have an available bed and are willing to accept the patient.  No   Patient/family informed of [facility]'s ownership interest in [facility] and [facility], as well as of the fact that they are under no obligation to receive care at these facilities.  PASRR submitted to EDS on       PASRR number received on       Existing PASRR number confirmed on 09/17/15     FL2 transmitted to all facilities in geographic area requested by pt/family on 09/17/15     FL2 transmitted to all facilities within larger geographic area on       Patient informed that his/her managed care company has contracts with or will negotiate with certain facilities, including the following:        Yes   Patient/family informed of bed offers received.  Patient chooses bed at  [facility]     Physician recommends and patient chooses bed at      Patient to be transferred to  [facility] on 09/18/15.  Patient to be transferred to facility by  (EMS)     Patient family notified on 09/18/15 of transfer.  Name of family member notified:   (Daughter )     PHYSICIAN       Additional Comment:    _______________________________________________ Idamae Lusherhristina  E Leann Mayweather, LCSW 09/18/2015, 9:30 AM

## 2015-09-18 NOTE — Progress Notes (Signed)
EMS called for non emergent transport [facility].

## 2015-09-18 NOTE — Progress Notes (Signed)
Report called to Viona GilmoreJennie Johnson, RN at [facility]

## 2015-09-18 NOTE — Progress Notes (Signed)
Clinical Social Worker was informed that patient will be medically ready to [facility]. Patient and her family are in a agreement with plan. CSW called Morrie SheldonAshley- Admissions at [facility] to confirm that patient's bed is ready. Provided patient's room number 219 and number to call for report [phone] . All discharge [facility] via [facility]. Rx's added to discharge packet. RN will call report and patient will [facility].   Woodroe Modehristina Gadge Hermiz, MSW, LCSW-A, LCAS-A Clinical Social Worker [phone]

## 2015-09-18 NOTE — Progress Notes (Signed)
Initial Heart Failure Clinic appointment scheduled on October 01, 2015 at 11:30am. Thank you.

## 2015-09-19 DIAGNOSIS — D649 Anemia, unspecified: Secondary | ICD-10-CM | POA: Diagnosis present

## 2015-09-19 LAB — CBC WITH DIFFERENTIAL/PLATELET
BASOS ABS: 0 10*3/uL (ref 0–0.1)
BASOS PCT: 0 %
Eosinophils Absolute: 0 10*3/uL (ref 0–0.7)
Eosinophils Relative: 0 %
HCT: 24.8 % — ABNORMAL LOW (ref 35.0–47.0)
Hemoglobin: 7.8 g/dL — ABNORMAL LOW (ref 12.0–16.0)
LYMPHS PCT: 25 %
Lymphs Abs: 1.1 10*3/uL (ref 1.0–3.6)
MCH: 25.5 pg — ABNORMAL LOW (ref 26.0–34.0)
MCHC: 31.5 g/dL — ABNORMAL LOW (ref 32.0–36.0)
MCV: 81 fL (ref 80.0–100.0)
Monocytes Absolute: 0.5 10*3/uL (ref 0.2–0.9)
Monocytes Relative: 10 %
NEUTROS ABS: 3 10*3/uL (ref 1.4–6.5)
Neutrophils Relative %: 65 %
PLATELETS: 233 10*3/uL (ref 150–440)
RBC: 3.07 MIL/uL — ABNORMAL LOW (ref 3.80–5.20)
RDW: 18.2 % — AB (ref 11.5–14.5)
WBC: 4.6 10*3/uL (ref 3.6–11.0)

## 2015-09-20 LAB — CULTURE, BLOOD (ROUTINE X 2)
CULTURE: NO GROWTH
Culture: NO GROWTH

## 2015-09-21 DIAGNOSIS — D649 Anemia, unspecified: Secondary | ICD-10-CM | POA: Diagnosis not present

## 2015-09-21 LAB — CBC WITH DIFFERENTIAL/PLATELET
BASOS ABS: 0 10*3/uL (ref 0–0.1)
BASOS PCT: 1 %
EOS PCT: 1 %
Eosinophils Absolute: 0 10*3/uL (ref 0–0.7)
HEMATOCRIT: 24.8 % — AB (ref 35.0–47.0)
Hemoglobin: 7.9 g/dL — ABNORMAL LOW (ref 12.0–16.0)
LYMPHS PCT: 22 %
Lymphs Abs: 1 10*3/uL (ref 1.0–3.6)
MCH: 25.5 pg — ABNORMAL LOW (ref 26.0–34.0)
MCHC: 31.8 g/dL — ABNORMAL LOW (ref 32.0–36.0)
MCV: 80.1 fL (ref 80.0–100.0)
MONO ABS: 0.4 10*3/uL (ref 0.2–0.9)
Monocytes Relative: 10 %
NEUTROS ABS: 3 10*3/uL (ref 1.4–6.5)
Neutrophils Relative %: 66 %
PLATELETS: 214 10*3/uL (ref 150–440)
RBC: 3.1 MIL/uL — AB (ref 3.80–5.20)
RDW: 18.8 % — AB (ref 11.5–14.5)
WBC: 4.5 10*3/uL (ref 3.6–11.0)

## 2015-09-22 ENCOUNTER — Encounter
Admission: RE | Admit: 2015-09-22 | Discharge: 2015-09-22 | Disposition: A | Discharge status: Home / Self Care | Payer: Medicare Other | Source: Ambulatory Visit | Attending: Internal Medicine | Admitting: Internal Medicine

## 2015-09-22 DIAGNOSIS — J189 Pneumonia, unspecified organism: Secondary | ICD-10-CM | POA: Insufficient documentation

## 2015-09-26 DIAGNOSIS — J189 Pneumonia, unspecified organism: Secondary | ICD-10-CM | POA: Diagnosis not present

## 2015-09-26 LAB — CBC WITH DIFFERENTIAL/PLATELET
Basophils Absolute: 0.1 10*3/uL (ref 0–0.1)
Basophils Relative: 2 %
Eosinophils Absolute: 0 10*3/uL (ref 0–0.7)
Eosinophils Relative: 1 %
HEMATOCRIT: 26 % — AB (ref 35.0–47.0)
HEMOGLOBIN: 8.3 g/dL — AB (ref 12.0–16.0)
LYMPHS ABS: 0.7 10*3/uL — AB (ref 1.0–3.6)
Lymphocytes Relative: 16 %
MCH: 26.1 pg (ref 26.0–34.0)
MCHC: 32 g/dL (ref 32.0–36.0)
MCV: 81.4 fL (ref 80.0–100.0)
MONOS PCT: 9 %
Monocytes Absolute: 0.4 10*3/uL (ref 0.2–0.9)
NEUTROS ABS: 3.2 10*3/uL (ref 1.4–6.5)
NEUTROS PCT: 72 %
Platelets: 180 10*3/uL (ref 150–440)
RBC: 3.19 MIL/uL — ABNORMAL LOW (ref 3.80–5.20)
RDW: 19.7 % — ABNORMAL HIGH (ref 11.5–14.5)
WBC: 4.4 10*3/uL (ref 3.6–11.0)

## 2015-09-27 ENCOUNTER — Non-Acute Institutional Stay (SKILLED_NURSING_FACILITY): Payer: Medicare Other | Admitting: Gerontology

## 2015-09-27 DIAGNOSIS — J189 Pneumonia, unspecified organism: Secondary | ICD-10-CM | POA: Diagnosis not present

## 2015-09-27 DIAGNOSIS — I340 Nonrheumatic mitral (valve) insufficiency: Secondary | ICD-10-CM | POA: Insufficient documentation

## 2015-09-27 DIAGNOSIS — M353 Polymyalgia rheumatica: Secondary | ICD-10-CM | POA: Insufficient documentation

## 2015-09-27 DIAGNOSIS — I5033 Acute on chronic diastolic (congestive) heart failure: Secondary | ICD-10-CM

## 2015-09-27 DIAGNOSIS — M1990 Unspecified osteoarthritis, unspecified site: Secondary | ICD-10-CM | POA: Insufficient documentation

## 2015-09-27 DIAGNOSIS — M810 Age-related osteoporosis without current pathological fracture: Secondary | ICD-10-CM | POA: Insufficient documentation

## 2015-09-27 DIAGNOSIS — I4891 Unspecified atrial fibrillation: Secondary | ICD-10-CM | POA: Insufficient documentation

## 2015-09-27 DIAGNOSIS — R7309 Other abnormal glucose: Secondary | ICD-10-CM | POA: Insufficient documentation

## 2015-09-27 NOTE — Progress Notes (Signed)
Location:  [place] [facility] Service:  SNF [phone]) Provider:  Toni Arthurs, NP-C  [place] III, MD  Patient Care Team: Adin Hector, MD as PCP - General (Internal Medicine)  Extended Emergency Contact Information Primary Emergency Contact: Andres Shad. Address: [address]          [place], [place] [address] Johnnette Litter of [place] Phone: [phone] Mobile Phone: [phone] Relation: Son Secondary Emergency Contact: Linus Mako, [place] [address] Johnnette Litter of [place] Phone: [phone] Relation: Friend  Code Status:  Full Goals of care: Advanced Directive information Advanced Directives 09/15/2015  Does patient have an advance directive? No  Would patient like information on creating an advanced directive? -     Chief Complaint  Patient presents with  . Shortness of Breath    HPI:  Pt is a 82 y.o. female seen today for an acute visit for shortness of breath. Pt is at the facility for rehab following bout with PNA., weakness. Pt reports she was not on oxygen at home but staff is now unable to wean her off O2. Nursing reports pts sats dropped with therapy over the past weekend, leading nursing to increase O2 flow to 3L [facility]. Today, she is 98% on 3 L. Denies n/v/d/f/c/cp.    Past Medical History  Diagnosis Date  . Anemia   . CHF (congestive heart failure) ([place])   . PMR (polymyalgia rheumatica) (HCC)   . Atrial fibrillation ([place])   . CVA (cerebral infarction)   . S/P MVR (mitral valve replacement)   . HLD (hyperlipidemia)   . PVD (peripheral vascular disease) [facility])    Past Surgical History  Procedure Laterality Date  . Cardiac valve replacement      Allergies  Allergen Reactions  . Calcitonin ([place])     Other reaction(s): Other (See Comments) Nasal irritation  . Codeine Nausea And Vomiting      Medication List       This list is accurate as of: 09/27/15 10:16 PM.  Always use your most recent med  list.               aspirin 81 MG EC tablet  Take 1 tablet (81 mg total) by mouth daily.     atorvastatin 40 MG tablet  Commonly known as:  LIPITOR  Take 1 tablet (40 mg total) by mouth daily at 6 PM.     azithromycin 250 MG tablet  Commonly known as:  ZITHROMAX  Take as directed     cefUROXime 500 MG tablet  Commonly known as:  CEFTIN  Take 1 tablet (500 mg total) by mouth 2 (two) times daily with a meal.     feeding supplement (ENSURE ENLIVE) Liqd  Take 237 mLs by mouth 3 (three) times daily between meals.     ferrous sulfate 325 (65 FE) MG tablet  Take 1 tablet (325 mg total) by mouth 2 (two) times daily with a meal.     furosemide 20 MG tablet  Commonly known as:  LASIX  Take 1 tablet (20 mg total) by mouth every other day.     ipratropium-albuterol 0.5-2.5 (3) MG/3ML Soln  Commonly known as:  DUONEB  Take 3 mLs by nebulization 3 (three) times daily.     pantoprazole 40 MG tablet  Commonly known as:  PROTONIX  Take 1 tablet (40 mg total) by mouth daily.  timolol 0.5 % ophthalmic solution  Commonly known as:  TIMOPTIC  Place 1 drop into both eyes daily.        Review of Systems  Constitutional: Positive for activity change (increased lethargy) and fatigue.  HENT: Negative.   Respiratory: Positive for cough and shortness of breath. Negative for choking and chest tightness.   Cardiovascular: Negative for chest pain, palpitations and leg swelling.  Gastrointestinal: Negative.   Genitourinary: Negative.   Musculoskeletal: Negative.   Skin: Negative.   Neurological: Negative for dizziness, tremors and headaches.  All other systems reviewed and are negative.   There is no immunization history for the selected administration types on file for this patient. There are no preventive care reminders to display for this patient. No flowsheet data found. Functional Status Survey:    There were no vitals filed for this visit. There is no weight on file to  calculate BMI. Physical Exam  Constitutional: She is oriented to person, place, and time. She appears well-developed and well-nourished. She is active and cooperative. Nasal cannula in place.  Eyes: Conjunctivae, EOM and lids are normal. Pupils are equal, round, and reactive to light.  Neck: Trachea normal. No JVD present.  Cardiovascular: Normal pulses.  An irregular rhythm present. Tachycardia present.  Exam reveals no gallop, no S4, no distant heart sounds and no friction rub.   Pulmonary/Chest: No accessory muscle usage. No respiratory distress. She has decreased breath sounds in the left upper field, the left middle field and the left lower field. She has no wheezes. She has no rhonchi. She has rales in the right upper field, the right middle field and the right lower field.  Neurological: She is alert and oriented to person, place, and time.  Skin: Skin is warm, dry and intact. No cyanosis. Nails show no clubbing.  Nursing note and vitals reviewed.   Labs reviewed:  Recent Labs  02/17/15 1048  02/22/15 0454 09/15/15 1125 09/16/15 0311  NA 135  < > 136 135 135  K 3.8  < > 4.6 3.7 3.5  CL 100*  < > 99* 103 101  CO2 26  < > 29 21* 26  GLUCOSE 110*  < > 169* 152* 101*  BUN 18  < > 51* 24* 16  CREATININE 0.73  < > 0.92 0.66 0.48  CALCIUM 8.4*  < > 8.3* 8.6* 8.1*  MG 1.8  --   --   --   --   PHOS 3.0  --   --   --   --   < > = values in this interval not displayed.  Recent Labs  02/17/15 1048  AST 22  ALT 10*  ALKPHOS 58  BILITOT 1.0  PROT 6.5  ALBUMIN 4.0    Recent Labs  09/19/15 0545 09/21/15 0600 09/26/15 0600  WBC 4.6 4.5 4.4  NEUTROABS 3.0 3.0 3.2  HGB 7.8* 7.9* 8.3*  HCT 24.8* 24.8* 26.0*  MCV 81.0 80.1 81.4  PLT 233 214 180   Lab Results  Component Value Date   TSH 1.817 09/15/2015   Lab Results  Component Value Date   HGBA1C 5.5 02/19/2015   Lab Results  Component Value Date   CHOL 240* 02/19/2015   HDL 89 02/19/2015   LDLCALC 134* 02/19/2015    TRIG 87 02/19/2015   CHOLHDL 2.7 02/19/2015    Significant Diagnostic Results in last 30 days:  Dg Chest 2 View  09/15/2015  CLINICAL DATA:  Progressive shortness of breath and hypoxia.  EXAM: CHEST  2 VIEW COMPARISON:  02/22/2015 FINDINGS: Interstitial and pulmonary vascular prominence present as well as bibasilar opacities. Findings are suggestive of interstitial edema as well as potentially additional infiltrates in both lower lung zones. Small pleural effusions are suspected. The heart size appears stable with evidence of a prosthetic mitral valve. Bony structures show mild spondylosis of the thoracic and lumbar spine. Atherosclerotic calcifications are visible in the abdominal aorta. IMPRESSION: 1. Findings suggestive of interstitial edema with small bilateral pleural effusions. There may be additional infiltrates in both lower lung zones. 2. Aortic atherosclerosis. Electronically Signed   By: Aletta Edouard M.D.   On: 09/15/2015 12:30    Assessment/Plan 1. Bilateral pneumonia  Augmentin 875 mg PO Q 12 hours x 7 days  Rocephin 1 gram IM x 1 now, reconstitute with Lidocaine  Incentive Spirometer 10 x/ hr while awake  Wean O2 to maintain sats > 92%   2. Acute on Chronic Diastolic Congestive Heart Failure  Lasix 20 mg IM x 1 now  Change po Lasix to 20 mg po Q day  Potassium Chloride 10 meq po Q day  Family/ staff Communication:   Total Time: 30 minutes  Documentation: 15 minutes  Face to Face: 15 minutes  Family/Phone:   Labs/tests ordered:  2 V CXR, cbc, met c on 7/7  Vikki Ports, NP-C [facility] [address], [place] [address] Cell Phone (Mon-Fri 8am-5pm):  [phone] On Call:  [phone] & follow prompts after 5pm & weekends Office Phone:  [phone] Office Fax:  [phone]

## 2015-09-28 DIAGNOSIS — J189 Pneumonia, unspecified organism: Secondary | ICD-10-CM | POA: Diagnosis not present

## 2015-09-28 LAB — COMPREHENSIVE METABOLIC PANEL
ALBUMIN: 3 g/dL — AB (ref 3.5–5.0)
ALT: 10 U/L — ABNORMAL LOW (ref 14–54)
AST: 16 U/L (ref 15–41)
Alkaline Phosphatase: 57 U/L (ref 38–126)
Anion gap: 11 (ref 5–15)
BUN: 15 mg/dL (ref 6–20)
CHLORIDE: 96 mmol/L — AB (ref 101–111)
CO2: 30 mmol/L (ref 22–32)
Calcium: 8.3 mg/dL — ABNORMAL LOW (ref 8.9–10.3)
Creatinine, Ser: 0.51 mg/dL (ref 0.44–1.00)
GFR calc Af Amer: >60 mL/min (ref >60)
GFR calc non Af Amer: >60 mL/min (ref >60)
GLUCOSE: 70 mg/dL (ref 65–99)
POTASSIUM: 2.7 mmol/L — AB (ref 3.5–5.1)
Sodium: 137 mmol/L (ref 135–145)
Total Bilirubin: 0.5 mg/dL (ref 0.3–1.2)
Total Protein: 5.2 g/dL — ABNORMAL LOW (ref 6.5–8.1)

## 2015-09-28 LAB — CBC WITH DIFFERENTIAL/PLATELET
Basophils Absolute: 0 10*3/uL (ref 0–0.1)
Basophils Relative: 1 %
EOS PCT: 1 %
Eosinophils Absolute: 0 10*3/uL (ref 0–0.7)
HCT: 26.7 % — ABNORMAL LOW (ref 35.0–47.0)
Hemoglobin: 8.5 g/dL — ABNORMAL LOW (ref 12.0–16.0)
LYMPHS ABS: 0.9 10*3/uL — AB (ref 1.0–3.6)
LYMPHS PCT: 20 %
MCH: 26 pg (ref 26.0–34.0)
MCHC: 31.7 g/dL — ABNORMAL LOW (ref 32.0–36.0)
MCV: 82.1 fL (ref 80.0–100.0)
MONO ABS: 0.4 10*3/uL (ref 0.2–0.9)
MONOS PCT: 9 %
Neutro Abs: 3.4 10*3/uL (ref 1.4–6.5)
Neutrophils Relative %: 69 %
PLATELETS: 193 10*3/uL (ref 150–440)
RBC: 3.25 MIL/uL — AB (ref 3.80–5.20)
RDW: 19.9 % — AB (ref 11.5–14.5)
WBC: 4.8 10*3/uL (ref 3.6–11.0)

## 2015-10-01 ENCOUNTER — Telehealth: Payer: Self-pay | Admitting: Family

## 2015-10-01 ENCOUNTER — Ambulatory Visit: Payer: Medicare Other | Admitting: Family

## 2015-10-01 NOTE — Telephone Encounter (Signed)
Patient missed her initial appointment at the Heart Failure Clinic on 10/01/15. Will attempt to reschedule.

## 2015-10-02 DIAGNOSIS — J189 Pneumonia, unspecified organism: Secondary | ICD-10-CM | POA: Diagnosis not present

## 2015-10-02 LAB — CBC WITH DIFFERENTIAL/PLATELET
BASOS ABS: 0 10*3/uL (ref 0–0.1)
Basophils Relative: 1 %
EOS PCT: 1 %
Eosinophils Absolute: 0 10*3/uL (ref 0–0.7)
HCT: 26 % — ABNORMAL LOW (ref 35.0–47.0)
Hemoglobin: 8.4 g/dL — ABNORMAL LOW (ref 12.0–16.0)
LYMPHS PCT: 14 %
Lymphs Abs: 0.7 10*3/uL — ABNORMAL LOW (ref 1.0–3.6)
MCH: 25.7 pg — AB (ref 26.0–34.0)
MCHC: 32.1 g/dL (ref 32.0–36.0)
MCV: 79.9 fL — AB (ref 80.0–100.0)
MONO ABS: 0.5 10*3/uL (ref 0.2–0.9)
Monocytes Relative: 10 %
Neutro Abs: 3.9 10*3/uL (ref 1.4–6.5)
Neutrophils Relative %: 74 %
PLATELETS: 238 10*3/uL (ref 150–440)
RBC: 3.26 MIL/uL — ABNORMAL LOW (ref 3.80–5.20)
RDW: 18.8 % — AB (ref 11.5–14.5)
WBC: 5.2 10*3/uL (ref 3.6–11.0)

## 2015-10-02 LAB — COMPREHENSIVE METABOLIC PANEL
ALT: 11 U/L — ABNORMAL LOW (ref 14–54)
ANION GAP: 7 (ref 5–15)
AST: 16 U/L (ref 15–41)
Albumin: 2.8 g/dL — ABNORMAL LOW (ref 3.5–5.0)
Alkaline Phosphatase: 64 U/L (ref 38–126)
BUN: 18 mg/dL (ref 6–20)
CHLORIDE: 95 mmol/L — AB (ref 101–111)
CO2: 34 mmol/L — ABNORMAL HIGH (ref 22–32)
Calcium: 8.7 mg/dL — ABNORMAL LOW (ref 8.9–10.3)
Creatinine, Ser: 0.48 mg/dL (ref 0.44–1.00)
Glucose, Bld: 90 mg/dL (ref 65–99)
POTASSIUM: 3.3 mmol/L — AB (ref 3.5–5.1)
Sodium: 136 mmol/L (ref 135–145)
Total Bilirubin: 0.8 mg/dL (ref 0.3–1.2)
Total Protein: 5.4 g/dL — ABNORMAL LOW (ref 6.5–8.1)

## 2015-10-04 ENCOUNTER — Ambulatory Visit: Payer: Medicare Other

## 2015-10-09 DIAGNOSIS — J189 Pneumonia, unspecified organism: Secondary | ICD-10-CM | POA: Diagnosis not present

## 2015-10-09 LAB — COMPREHENSIVE METABOLIC PANEL
ALT: 14 U/L (ref 14–54)
ANION GAP: 11 (ref 5–15)
AST: 19 U/L (ref 15–41)
Albumin: 3 g/dL — ABNORMAL LOW (ref 3.5–5.0)
Alkaline Phosphatase: 74 U/L (ref 38–126)
BILIRUBIN TOTAL: 1 mg/dL (ref 0.3–1.2)
BUN: 20 mg/dL (ref 6–20)
CHLORIDE: 96 mmol/L — AB (ref 101–111)
CO2: 26 mmol/L (ref 22–32)
Calcium: 8.5 mg/dL — ABNORMAL LOW (ref 8.9–10.3)
Creatinine, Ser: 0.5 mg/dL (ref 0.44–1.00)
Glucose, Bld: 89 mg/dL (ref 65–99)
POTASSIUM: 4.5 mmol/L (ref 3.5–5.1)
Sodium: 133 mmol/L — ABNORMAL LOW (ref 135–145)
TOTAL PROTEIN: 5.8 g/dL — AB (ref 6.5–8.1)

## 2015-10-09 LAB — CBC WITH DIFFERENTIAL/PLATELET
BASOS ABS: 0 10*3/uL (ref 0–0.1)
Basophils Relative: 0 %
EOS PCT: 0 %
Eosinophils Absolute: 0 10*3/uL (ref 0–0.7)
HEMATOCRIT: 30.4 % — AB (ref 35.0–47.0)
Hemoglobin: 9.9 g/dL — ABNORMAL LOW (ref 12.0–16.0)
LYMPHS PCT: 10 %
Lymphs Abs: 0.8 10*3/uL — ABNORMAL LOW (ref 1.0–3.6)
MCH: 26.4 pg (ref 26.0–34.0)
MCHC: 32.4 g/dL (ref 32.0–36.0)
MCV: 81.5 fL (ref 80.0–100.0)
MONO ABS: 0.7 10*3/uL (ref 0.2–0.9)
MONOS PCT: 10 %
NEUTROS ABS: 6.1 10*3/uL (ref 1.4–6.5)
Neutrophils Relative %: 80 %
PLATELETS: 362 10*3/uL (ref 150–440)
RBC: 3.73 MIL/uL — ABNORMAL LOW (ref 3.80–5.20)
RDW: 19.4 % — AB (ref 11.5–14.5)
WBC: 7.6 10*3/uL (ref 3.6–11.0)

## 2015-10-11 ENCOUNTER — Non-Acute Institutional Stay (SKILLED_NURSING_FACILITY): Payer: Medicare Other | Admitting: Gerontology

## 2015-10-11 DIAGNOSIS — J189 Pneumonia, unspecified organism: Secondary | ICD-10-CM | POA: Diagnosis not present

## 2015-10-11 DIAGNOSIS — R627 Adult failure to thrive: Secondary | ICD-10-CM | POA: Diagnosis not present

## 2015-10-11 DIAGNOSIS — I5033 Acute on chronic diastolic (congestive) heart failure: Secondary | ICD-10-CM

## 2015-10-11 NOTE — Progress Notes (Signed)
Location:  [facility] Service:  SNF [phone]) Provider:  Lorenso Quarry, NP-C  BERT Nani Ravens, MD  Patient Care Team: Lynnea Ferrier, MD as PCP - General (Internal Medicine)  Extended Emergency Contact Information Primary Emergency Contact: Marilu Favre. Address: [address]          [place], [place] [address] Darden Amber of [place] Home Phone: [phone] Mobile Phone: [phone] Relation: Son Secondary Emergency Contact: Georgiana [place], [place] [address] Darden Amber of [place] Home Phone: [phone] Relation: Friend  Code Status:  Full Goals of care: Advanced Directive information Advanced Directives 09/15/2015  Does patient have an advance directive? No  Would patient like information on creating an advanced directive? -     Chief Complaint  Patient presents with  . Acute Visit    HPI:  Pt is a 82 y.o. female seen today for an acute visit for follow up after completion of treatment for B-pleural effusions, CHF exacerbation as well as weakness, nausea and weightloss. Pt reports she's "not sick, just wore out." pt had PT earlier. She does report she feels her energy level has decreased. She also reports "it's not that I don't have an appetite, I just don't like what you all serve." She reports she already has her family bring snacks, which she eats a little of. She reports otherwise she is feeling good. Pt is scheduled for d/c Sunday.   Past Medical History  Diagnosis Date  . Anemia   . CHF (congestive heart failure) (HCC)   . PMR (polymyalgia rheumatica) (HCC)   . Atrial fibrillation (HCC)   . CVA (cerebral infarction)   . S/P MVR (mitral valve replacement)   . HLD (hyperlipidemia)   . PVD (peripheral vascular disease) [facility])    Past Surgical History  Procedure Laterality Date  . Cardiac valve replacement      Allergies  Allergen Reactions  . Calcitonin ([place])     Other reaction(s): Other (See Comments) Nasal  irritation  . Codeine Nausea And Vomiting      Medication List       This list is accurate as of: 10/11/15  9:49 PM.  Always use your most recent med list.               ADVAIR DISKUS 250-50 MCG/DOSE Aepb  Generic drug:  Fluticasone-Salmeterol     aspirin 81 MG EC tablet  Take 1 tablet (81 mg total) by mouth daily.     atorvastatin 40 MG tablet  Commonly known as:  LIPITOR  Take 1 tablet (40 mg total) by mouth daily at 6 PM.     azithromycin 250 MG tablet  Commonly known as:  ZITHROMAX  Take as directed     cefUROXime 500 MG tablet  Commonly known as:  CEFTIN  Take 1 tablet (500 mg total) by mouth 2 (two) times daily with a meal.     feeding supplement (ENSURE ENLIVE) Liqd  Take 237 mLs by mouth 3 (three) times daily between meals.     ferrous sulfate 325 (65 FE) MG tablet  Take 1 tablet (325 mg total) by mouth 2 (two) times daily with a meal.     furosemide 20 MG tablet  Commonly known as:  LASIX  Take 1 tablet (20 mg total) by mouth every other day.     ipratropium-albuterol 0.5-2.5 (3) MG/3ML Soln  Commonly known as:  DUONEB  Take 3 mLs by nebulization 3 (three) times daily.     pantoprazole 40 MG tablet  Commonly known as:  PROTONIX  Take 1 tablet (40 mg total) by mouth daily.     timolol 0.5 % ophthalmic solution  Commonly known as:  TIMOPTIC  Place 1 drop into both eyes daily.        Review of Systems  Constitutional: Positive for activity change, appetite change, fatigue and unexpected weight change.  HENT: Negative.   Respiratory: Positive for shortness of breath. Negative for cough, choking and chest tightness.   Cardiovascular: Negative for chest pain and leg swelling.  Gastrointestinal: Negative.   Genitourinary: Negative.   Musculoskeletal: Negative.   Skin: Positive for pallor.  Neurological: Negative.   Psychiatric/Behavioral: Negative.   All other systems reviewed and are negative.   There is no immunization history for the selected  administration types on file for this patient. Pertinent  Health Maintenance Due  Topic Date Due  . DEXA SCAN  10/20/1990  . PNA vac Low Risk Adult (1 of 2 - PCV13) 10/20/1990  . INFLUENZA VACCINE  10/23/2015   No flowsheet data found. Functional Status Survey:    Filed Vitals:   10/11/15 1638  BP: 99/68  Pulse: 105  Temp: 97.7 F (36.5 C)  Resp: 20  SpO2: 95%   There is no weight on file to calculate BMI. Physical Exam  Constitutional: She is oriented to person, place, and time. Vital signs are normal. She appears cachectic. She is cooperative.  HENT:  Head: Normocephalic and atraumatic.  Eyes: Conjunctivae are normal. Pupils are equal, round, and reactive to light.  Neck: Normal range of motion. Neck supple. No JVD present. No thyromegaly present.  Cardiovascular: S1 normal, S2 normal, normal heart sounds, intact distal pulses and normal pulses.  An irregular rhythm present. Tachycardia present.  Exam reveals no gallop, no distant heart sounds and no friction rub.   No murmur heard. Pulmonary/Chest: Effort normal and breath sounds normal. No accessory muscle usage or stridor. No respiratory distress. She has no wheezes. She has no rales. She exhibits no tenderness.  Abdominal: Soft. Bowel sounds are normal.  Musculoskeletal: Normal range of motion. She exhibits no edema or tenderness.  Lymphadenopathy:    She has no cervical adenopathy.  Neurological: She is alert and oriented to person, place, and time.  Skin: Skin is warm and dry. No rash noted. No erythema. There is pallor.  Psychiatric: She has a normal mood and affect. Her behavior is normal. Judgment and thought content normal.  Nursing note and vitals reviewed.   Labs reviewed:  Recent Labs  02/17/15 1048  09/28/15 0650 10/02/15 0400 10/09/15 0730  NA 135  < > 137 136 133*  K 3.8  < > 2.7* 3.3* 4.5  CL 100*  < > 96* 95* 96*  CO2 26  < > 30 34* 26  GLUCOSE 110*  < > 70 90 89  BUN 18  < > 15 18 20     CREATININE 0.73  < > 0.51 0.48 0.50  CALCIUM 8.4*  < > 8.3* 8.7* 8.5*  MG 1.8  --   --   --   --   PHOS 3.0  --   --   --   --   < > = values in this interval not displayed.  Recent Labs  09/28/15 0650 10/02/15 0400 10/09/15 0730  AST 16 16 19   ALT 10* 11* 14  ALKPHOS 57 64 74  BILITOT 0.5 0.8 1.0  PROT 5.2* 5.4* 5.8*  ALBUMIN 3.0* 2.8* 3.0*    Recent Labs  09/28/15 0650 10/02/15 0400 10/09/15 0730  WBC 4.8 5.2 7.6  NEUTROABS 3.4 3.9 6.1  HGB 8.5* 8.4* 9.9*  HCT 26.7* 26.0* 30.4*  MCV 82.1 79.9* 81.5  PLT 193 238 362   Lab Results  Component Value Date   TSH 1.817 09/15/2015   Lab Results  Component Value Date   HGBA1C 5.5 02/19/2015   Lab Results  Component Value Date   CHOL 240* 02/19/2015   HDL 89 02/19/2015   LDLCALC 134* 02/19/2015   TRIG 87 02/19/2015   CHOLHDL 2.7 02/19/2015    Significant Diagnostic Results in last 30 days:  Dg Chest 2 View  09/15/2015  CLINICAL DATA:  Progressive shortness of breath and hypoxia. EXAM: CHEST  2 VIEW COMPARISON:  02/22/2015 FINDINGS: Interstitial and pulmonary vascular prominence present as well as bibasilar opacities. Findings are suggestive of interstitial edema as well as potentially additional infiltrates in both lower lung zones. Small pleural effusions are suspected. The heart size appears stable with evidence of a prosthetic mitral valve. Bony structures show mild spondylosis of the thoracic and lumbar spine. Atherosclerotic calcifications are visible in the abdominal aorta. IMPRESSION: 1. Findings suggestive of interstitial edema with small bilateral pleural effusions. There may be additional infiltrates in both lower lung zones. 2. Aortic atherosclerosis. Electronically Signed   By: Irish Lack M.D.   On: 09/15/2015 12:30    Assessment/Plan 1. Acute on chronic diastolic congestive heart failure (HCC)  Lasix 40 mg IM x 1 now  Increase po Lasix to 30 mg po Q day  Give 30 meq PO Potassium chloride x 1  with lasix injection  Metoprolol 25 mg po Q 12 hours for tachycardia  2. Bilateral pneumonia  Levaquin 750 mg po Q day x 7 days starting tomorrow  Rocephin 1 gram IM x 1 now, reconstitute with Lidocaine  Wean oxygen as tolerated to maintain sats >92%  3. Adult Failure to Thrive  Palliative Care Consult re: declining health  D/C remeron  Marinol 2.5 mg po BID- appetite stimulant  Increase Effexor to 75 mg PO Q day  Family/ staff Communication:   Total Time: 40 minutes  Documentation: 20 minutes  Face to Face: 20 minutes  Family/Phone:   Labs/tests ordered:  2V CXR, abdominal series  Brynda Rim, NP-C Geriatrics [facility] [address] [place], [place] [address] Cell Phone (Mon-Fri 8am-5pm):  [phone] On Call:  [phone] & follow prompts after 5pm & weekends Office Phone:  [phone] Office Fax:  [phone]

## 2015-10-12 DIAGNOSIS — J189 Pneumonia, unspecified organism: Secondary | ICD-10-CM | POA: Diagnosis not present

## 2015-10-12 LAB — CBC WITH DIFFERENTIAL/PLATELET
Basophils Absolute: 0 10*3/uL (ref 0–0.1)
Basophils Relative: 0 %
EOS PCT: 1 %
Eosinophils Absolute: 0 10*3/uL (ref 0–0.7)
HEMATOCRIT: 30 % — AB (ref 35.0–47.0)
Hemoglobin: 9.5 g/dL — ABNORMAL LOW (ref 12.0–16.0)
LYMPHS ABS: 0.8 10*3/uL — AB (ref 1.0–3.6)
LYMPHS PCT: 14 %
MCH: 25.7 pg — AB (ref 26.0–34.0)
MCHC: 31.7 g/dL — ABNORMAL LOW (ref 32.0–36.0)
MCV: 81 fL (ref 80.0–100.0)
MONO ABS: 0.6 10*3/uL (ref 0.2–0.9)
Monocytes Relative: 10 %
NEUTROS ABS: 4.2 10*3/uL (ref 1.4–6.5)
Neutrophils Relative %: 75 %
PLATELETS: 298 10*3/uL (ref 150–440)
RBC: 3.7 MIL/uL — ABNORMAL LOW (ref 3.80–5.20)
RDW: 18.2 % — AB (ref 11.5–14.5)
WBC: 5.6 10*3/uL (ref 3.6–11.0)

## 2015-10-12 LAB — COMPREHENSIVE METABOLIC PANEL
ALBUMIN: 3 g/dL — AB (ref 3.5–5.0)
ALT: 14 U/L (ref 14–54)
AST: 20 U/L (ref 15–41)
Alkaline Phosphatase: 73 U/L (ref 38–126)
Anion gap: 11 (ref 5–15)
BUN: 18 mg/dL (ref 6–20)
CHLORIDE: 96 mmol/L — AB (ref 101–111)
CO2: 27 mmol/L (ref 22–32)
Calcium: 8.5 mg/dL — ABNORMAL LOW (ref 8.9–10.3)
Creatinine, Ser: 0.42 mg/dL — ABNORMAL LOW (ref 0.44–1.00)
GFR calc Af Amer: >60 mL/min (ref >60)
GFR calc non Af Amer: >60 mL/min (ref >60)
GLUCOSE: 79 mg/dL (ref 65–99)
POTASSIUM: 3.8 mmol/L (ref 3.5–5.1)
SODIUM: 134 mmol/L — AB (ref 135–145)
Total Bilirubin: 0.9 mg/dL (ref 0.3–1.2)
Total Protein: 5.6 g/dL — ABNORMAL LOW (ref 6.5–8.1)

## 2015-10-22 ENCOUNTER — Non-Acute Institutional Stay (SKILLED_NURSING_FACILITY): Payer: Medicare Other | Admitting: Gerontology

## 2015-10-22 DIAGNOSIS — J189 Pneumonia, unspecified organism: Secondary | ICD-10-CM | POA: Diagnosis not present

## 2015-10-22 DIAGNOSIS — R627 Adult failure to thrive: Secondary | ICD-10-CM | POA: Diagnosis not present

## 2015-10-23 ENCOUNTER — Encounter
Admission: RE | Admit: 2015-10-23 | Discharge: 2015-10-23 | Disposition: A | Discharge status: Home / Self Care | Payer: Medicare Other | Source: Ambulatory Visit | Attending: Internal Medicine | Admitting: Internal Medicine

## 2015-10-23 DIAGNOSIS — D649 Anemia, unspecified: Secondary | ICD-10-CM | POA: Insufficient documentation

## 2015-10-23 LAB — COMPREHENSIVE METABOLIC PANEL
ALBUMIN: 3.2 g/dL — AB (ref 3.5–5.0)
ALK PHOS: 61 U/L (ref 38–126)
ALT: 18 U/L (ref 14–54)
ANION GAP: 10 (ref 5–15)
AST: 27 U/L (ref 15–41)
BUN: 20 mg/dL (ref 6–20)
CO2: 31 mmol/L (ref 22–32)
Calcium: 8.8 mg/dL — ABNORMAL LOW (ref 8.9–10.3)
Chloride: 99 mmol/L — ABNORMAL LOW (ref 101–111)
Creatinine, Ser: 0.48 mg/dL (ref 0.44–1.00)
GFR calc Af Amer: >60 mL/min (ref >60)
GFR calc non Af Amer: >60 mL/min (ref >60)
GLUCOSE: 107 mg/dL — AB (ref 65–99)
POTASSIUM: 3.2 mmol/L — AB (ref 3.5–5.1)
SODIUM: 140 mmol/L (ref 135–145)
Total Bilirubin: 0.7 mg/dL (ref 0.3–1.2)
Total Protein: 6.1 g/dL — ABNORMAL LOW (ref 6.5–8.1)

## 2015-10-23 LAB — CBC
HEMATOCRIT: 29.1 % — AB (ref 35.0–47.0)
HEMOGLOBIN: 9.4 g/dL — AB (ref 12.0–16.0)
MCH: 25.8 pg — AB (ref 26.0–34.0)
MCHC: 32.2 g/dL (ref 32.0–36.0)
MCV: 80.2 fL (ref 80.0–100.0)
Platelets: 207 10*3/uL (ref 150–440)
RBC: 3.62 MIL/uL — ABNORMAL LOW (ref 3.80–5.20)
RDW: 18.7 % — ABNORMAL HIGH (ref 11.5–14.5)
WBC: 6.5 10*3/uL (ref 3.6–11.0)

## 2015-10-23 NOTE — Progress Notes (Signed)
Location:      Place of Service:  SNF (31) Provider:  Toni Arthurs, NP-C  [place] III, MD  Patient Care Team: Adin Hector, MD as PCP - General (Internal Medicine)  Extended Emergency Contact Information Primary Emergency Contact: Andres Shad. Address: [address]          [place], [place] [address] Johnnette Litter of [place] Phone: [phone] Mobile Phone: [phone] Relation: Son Secondary Emergency Contact: Linus Mako, [place] [address] Johnnette Litter of [place] Phone: [phone] Relation: Friend  Code Status:  Full Goals of care: Advanced Directive information Advanced Directives 09/15/2015  Does patient have an advance directive? No  Would patient like information on creating an advanced directive? -     Chief Complaint  Patient presents with  . Follow-up    HPI:  Pt is a 82 y.o. female seen today for an acute visit for follow up after treatment for B-PNA, effusions of IV antibiotics. Pt reports she is feeling better. Coughing less. Breathing easier now. Pt has been on an appetite stimulant. She reports it is doing well for her. Her appetite seems to have increased. Weight is stable. No c/o pain or dyspnea. No other c/o.   Past Medical History:  Diagnosis Date  . Anemia   . Atrial fibrillation ([place])   . CHF (congestive heart failure) ([place])   . CVA (cerebral infarction)   . HLD (hyperlipidemia)   . PMR (polymyalgia rheumatica) (HCC)   . PVD (peripheral vascular disease) ([place])   . S/P MVR (mitral valve replacement)    Past Surgical History:  Procedure Laterality Date  . CARDIAC VALVE REPLACEMENT      Allergies  Allergen Reactions  . Calcitonin ([place])     Other reaction(s): Other (See Comments) Nasal irritation  . Codeine Nausea And Vomiting      Medication List       Accurate as of 10/22/15 11:59 PM. Always use your most recent med list.          ADVAIR DISKUS 250-50 MCG/DOSE Aepb Generic drug:   Fluticasone-Salmeterol   aspirin 81 MG EC tablet Take 1 tablet (81 mg total) by mouth daily.   atorvastatin 40 MG tablet Commonly known as:  LIPITOR Take 1 tablet (40 mg total) by mouth daily at 6 PM.   azithromycin 250 MG tablet Commonly known as:  ZITHROMAX Take as directed   cefUROXime 500 MG tablet Commonly known as:  CEFTIN Take 1 tablet (500 mg total) by mouth 2 (two) times daily with a meal.   feeding supplement (ENSURE ENLIVE) Liqd Take 237 mLs by mouth 3 (three) times daily between meals.   ferrous sulfate 325 (65 FE) MG tablet Take 1 tablet (325 mg total) by mouth 2 (two) times daily with a meal.   furosemide 20 MG tablet Commonly known as:  LASIX Take 1 tablet (20 mg total) by mouth every other day.   ipratropium-albuterol 0.5-2.5 (3) MG/3ML Soln Commonly known as:  DUONEB Take 3 mLs by nebulization 3 (three) times daily.   pantoprazole 40 MG tablet Commonly known as:  PROTONIX Take 1 tablet (40 mg total) by mouth daily.   timolol 0.5 % ophthalmic solution Commonly known as:  TIMOPTIC Place 1 drop into both eyes daily.       Review of Systems  Constitutional: Positive for appetite change. Negative for activity change, chills, diaphoresis and fever.  HENT: Negative for congestion, rhinorrhea,  sinus pressure, sneezing, sore throat, trouble swallowing and voice change.   Eyes: Negative for pain, redness and visual disturbance.  Respiratory: Negative for apnea, cough, choking, chest tightness, shortness of breath and wheezing.   Cardiovascular: Positive for leg swelling. Negative for chest pain and palpitations.  Gastrointestinal: Negative for abdominal distention, abdominal pain, constipation, diarrhea and nausea.  Genitourinary: Negative for difficulty urinating, dysuria, frequency and urgency.  Musculoskeletal: Positive for arthralgias (typical arthritis). Negative for back pain, gait problem and myalgias.  Skin: Negative for color change, pallor, rash and  wound.  Neurological: Negative for dizziness, tremors, syncope, speech difficulty, weakness, numbness and headaches.  Psychiatric/Behavioral: Negative for agitation and behavioral problems.  All other systems reviewed and are negative.   There is no immunization history for the selected administration types on file for this patient. Pertinent  Health Maintenance Due  Topic Date Due  . DEXA SCAN  10/20/1990  . PNA vac Low Risk Adult (1 of 2 - PCV13) 10/20/1990  . INFLUENZA VACCINE  10/23/2015   No flowsheet data found. Functional Status Survey:    Vitals:   10/22/15 0500  BP: (!) 94/44  Pulse: 91  Resp: (!) 24  Temp: (!) 96 F (35.6 C)  SpO2: 93%  Weight: 100 lb 4.8 oz (45.5 kg)   Body mass index is 19.59 kg/m. Physical Exam  Constitutional: She is oriented to person, place, and time. Vital signs are normal. She appears well-developed. She appears cachectic. She is active and cooperative. She has a sickly appearance. No distress.  HENT:  Head: Normocephalic and atraumatic.  Mouth/Throat: Uvula is midline and oropharynx is clear and moist. Mucous membranes are pale, dry and not cyanotic.  Eyes: Conjunctivae, EOM and lids are normal. Pupils are equal, round, and reactive to light.  Neck: Trachea normal, normal range of motion and full passive range of motion without pain. Neck supple. No JVD present. No tracheal deviation, no edema and no erythema present. No thyromegaly present.  Cardiovascular: Regular rhythm, normal heart sounds, intact distal pulses and normal pulses.  Tachycardia present.  Exam reveals no gallop, no distant heart sounds and no friction rub.   No murmur heard. Pulmonary/Chest: Effort normal. No accessory muscle usage. No respiratory distress. She has decreased breath sounds in the right lower field and the left lower field. She has no wheezes. She exhibits no tenderness.  Abdominal: Soft. Normal appearance and bowel sounds are normal. She exhibits no  distension and no ascites. There is no tenderness.  Musculoskeletal: Normal range of motion. She exhibits no edema or tenderness.       Thoracic back: She exhibits deformity (kyphosis).  Expected osteoarthritis, stiffness  Neurological: She is alert and oriented to person, place, and time. She has normal strength. She is not disoriented.  Skin: Skin is warm, dry and intact. Petechiae, purpura and rash noted. She is not diaphoretic. No cyanosis. No pallor. Nails show no clubbing.     Psychiatric: She has a normal mood and affect. Her speech is normal and behavior is normal. Judgment and thought content normal. Cognition and memory are normal.  Nursing note and vitals reviewed.   Labs reviewed:  Recent Labs  02/17/15 1048  10/09/15 0730 10/12/15 1620 10/23/15 1039  NA 135  < > 133* 134* 140  K 3.8  < > 4.5 3.8 3.2*  CL 100*  < > 96* 96* 99*  CO2 26  < > 26 27 31   GLUCOSE 110*  < > 89 79 107*  BUN  18  < > 20 18 20   CREATININE 0.73  < > 0.50 0.42* 0.48  CALCIUM 8.4*  < > 8.5* 8.5* 8.8*  MG 1.8  --   --   --   --   PHOS 3.0  --   --   --   --   < > = values in this interval not displayed.  Recent Labs  10/09/15 0730 10/12/15 1620 10/23/15 1039  AST 19 20 27   ALT 14 14 18   ALKPHOS 74 73 61  BILITOT 1.0 0.9 0.7  PROT 5.8* 5.6* 6.1*  ALBUMIN 3.0* 3.0* 3.2*    Recent Labs  10/02/15 0400 10/09/15 0730 10/12/15 1620 10/23/15 1039  WBC 5.2 7.6 5.6 6.5  NEUTROABS 3.9 6.1 4.2  --   HGB 8.4* 9.9* 9.5* 9.4*  HCT 26.0* 30.4* 30.0* 29.1*  MCV 79.9* 81.5 81.0 80.2  PLT 238 362 298 207   Lab Results  Component Value Date   TSH 1.817 09/15/2015   Lab Results  Component Value Date   HGBA1C 5.5 02/19/2015   Lab Results  Component Value Date   CHOL 240 (H) 02/19/2015   HDL 89 02/19/2015   LDLCALC 134 (H) 02/19/2015   TRIG 87 02/19/2015   CHOLHDL 2.7 02/19/2015    Significant Diagnostic Results in last 30 days:  No results found.  Assessment/Plan 1. Bilateral  pneumonia  Continue to get OOB to chair as often as tolerable. IS/TCDB,      2. Adult failure to thrive  Encourage po intake of favorite foods. Continue taking in Ensure. Increase Marinol to TID  Family/ staff Communication:   Total Time: 30 minutes  Documentation: 15 minutes  Face to Face: 15 minutes  Family/Phone: Daughter and son-in-law present in the room during assessment. All questions answered.     Labs/tests ordered:  Cbc, met c  Medication list reviewed and assessed for continued appropriateness.  Vikki Ports, NP-C Geriatrics [facility] [phone] [address] [place], [place] [address] Cell Phone (Mon-Fri 8am-5pm):  [phone] On Call:  [phone] & follow prompts after 5pm & weekends Office Phone:  [phone] Office Fax:  [phone]

## 2015-10-30 ENCOUNTER — Non-Acute Institutional Stay (SKILLED_NURSING_FACILITY): Payer: Medicare Other | Admitting: Gerontology

## 2015-10-30 DIAGNOSIS — R627 Adult failure to thrive: Secondary | ICD-10-CM | POA: Diagnosis not present

## 2015-10-30 DIAGNOSIS — J189 Pneumonia, unspecified organism: Secondary | ICD-10-CM | POA: Diagnosis not present

## 2015-10-30 NOTE — Progress Notes (Signed)
Location:      Place of Service:  SNF (31) Provider:  Lorenso Quarry, NP-C  BERT Nani Ravens, MD  Patient Care Team: Lynnea Ferrier, MD as PCP - General (Internal Medicine)  Extended Emergency Contact Information Primary Emergency Contact: Marilu Favre. Address: [address]          [place], [place] [address] [place] Home Phone: [phone] Mobile Phone: [phone] Relation: Son Secondary Emergency Contact: Georgiana [place], [place] [address] [place] of [place] Home Phone: [phone] Relation: Friend  Code Status:  DNR Goals of care: Advanced Directive information Advanced Directives 09/15/2015  Does patient have an advance directive? No  Would patient like information on creating an advanced directive? -     Chief Complaint  Patient presents with  . Follow-up    HPI:  Pt is a 82 y.o. female seen today for medical management of chronic diseases. She is s/p treatment for PNA/ pleural effusions. She reports she is feeling better. No difficulty breathing. No chest pains. No cough. Res continues to report poor appetite. Says the "appetite stimulant is not working." Denies nausea or GI disturbance. No other complaints.      Past Medical History:  Diagnosis Date  . Anemia   . Atrial fibrillation (HCC)   . CHF (congestive heart failure) (HCC)   . CVA (cerebral infarction)   . HLD (hyperlipidemia)   . PMR (polymyalgia rheumatica) (HCC)   . PVD (peripheral vascular disease) (HCC)   . S/P MVR (mitral valve replacement)    Past Surgical History:  Procedure Laterality Date  . CARDIAC VALVE REPLACEMENT      Allergies  Allergen Reactions  . Calcitonin ([place])     Other reaction(s): Other (See Comments) Nasal irritation  . Codeine Nausea And Vomiting      Medication List       Accurate as of 10/30/15  6:15 PM. Always use your most recent med list.          ADVAIR DISKUS 250-50 MCG/DOSE Aepb Generic drug:   Fluticasone-Salmeterol   aspirin 81 MG EC tablet Take 1 tablet (81 mg total) by mouth daily.   atorvastatin 40 MG tablet Commonly known as:  LIPITOR Take 1 tablet (40 mg total) by mouth daily at 6 PM.   azithromycin 250 MG tablet Commonly known as:  ZITHROMAX Take as directed   cefUROXime 500 MG tablet Commonly known as:  CEFTIN Take 1 tablet (500 mg total) by mouth 2 (two) times daily with a meal.   feeding supplement (ENSURE ENLIVE) Liqd Take 237 mLs by mouth 3 (three) times daily between meals.   ferrous sulfate 325 (65 FE) MG tablet Take 1 tablet (325 mg total) by mouth 2 (two) times daily with a meal.   furosemide 20 MG tablet Commonly known as:  LASIX Take 1 tablet (20 mg total) by mouth every other day.   ipratropium-albuterol 0.5-2.5 (3) MG/3ML Soln Commonly known as:  DUONEB Take 3 mLs by nebulization 3 (three) times daily.   pantoprazole 40 MG tablet Commonly known as:  PROTONIX Take 1 tablet (40 mg total) by mouth daily.   timolol 0.5 % ophthalmic solution Commonly known as:  TIMOPTIC Place 1 drop into both eyes daily.       Review of Systems  Constitutional: Positive for appetite change and fatigue. Negative for activity change, chills, diaphoresis and fever.  HENT: Negative for congestion, rhinorrhea, sinus pressure, sneezing,  sore throat, trouble swallowing and voice change.   Eyes: Negative for pain, redness and visual disturbance.  Respiratory: Negative for apnea, cough, choking, chest tightness, shortness of breath and wheezing.   Cardiovascular: Negative for chest pain, palpitations and leg swelling.  Gastrointestinal: Negative for abdominal distention, abdominal pain, constipation, diarrhea and nausea.  Genitourinary: Negative for difficulty urinating, dysuria, frequency and urgency.  Musculoskeletal: Positive for arthralgias (typical arthritis). Negative for back pain, gait problem and myalgias.  Skin: Negative for color change, pallor, rash and  wound.  Neurological: Negative for dizziness, tremors, syncope, speech difficulty, weakness, numbness and headaches.  Psychiatric/Behavioral: Negative for agitation and behavioral problems.  All other systems reviewed and are negative.   There is no immunization history for the selected administration types on file for this patient. Pertinent  Health Maintenance Due  Topic Date Due  . DEXA SCAN  10/20/1990  . PNA vac Low Risk Adult (1 of 2 - PCV13) 10/20/1990  . INFLUENZA VACCINE  10/23/2015   No flowsheet data found. Functional Status Survey:    Vitals:   10/30/15 1814  BP: (!) 102/45  Pulse: 87  Resp: (!) 22  Temp: 98.4 F (36.9 C)  SpO2: 95%  Weight: 101 lb 1.6 oz (45.9 kg)   Body mass index is 19.74 kg/m. Physical Exam  Constitutional: She is oriented to person, place, and time. Vital signs are normal. She appears well-developed. She appears cachectic. She is active and cooperative. She has a sickly appearance. No distress.  HENT:  Head: Normocephalic and atraumatic.  Mouth/Throat: Uvula is midline and oropharynx is clear and moist. Mucous membranes are pale, dry and not cyanotic.  Eyes: Conjunctivae, EOM and lids are normal. Pupils are equal, round, and reactive to light.  Neck: Trachea normal, normal range of motion and full passive range of motion without pain. Neck supple. No JVD present. No tracheal deviation, no edema and no erythema present. No thyromegaly present.  Cardiovascular: Normal rate, regular rhythm, normal heart sounds, intact distal pulses and normal pulses.  Exam reveals no gallop, no distant heart sounds and no friction rub.   No murmur heard. Pulmonary/Chest: Effort normal and breath sounds normal. No accessory muscle usage. No respiratory distress. She has no wheezes. She has no rales. She exhibits no tenderness.  Abdominal: Soft. Normal appearance and bowel sounds are normal. She exhibits no distension and no ascites. There is no tenderness. There is  no guarding.  Musculoskeletal: Normal range of motion. She exhibits no edema or tenderness.       Thoracic back: She exhibits deformity (kyphosis).  Expected osteoarthritis, stiffness  Neurological: She is alert and oriented to person, place, and time. She has normal strength. She is not disoriented.  Skin: Skin is warm, dry and intact. Petechiae noted. No rash noted. She is not diaphoretic. No cyanosis or erythema. No pallor. Nails show no clubbing.     Psychiatric: She has a normal mood and affect. Her speech is normal and behavior is normal. Judgment and thought content normal. Cognition and memory are normal.  Nursing note and vitals reviewed.   Labs reviewed:  Recent Labs  02/17/15 1048  10/09/15 0730 10/12/15 1620 10/23/15 1039  NA 135  < > 133* 134* 140  K 3.8  < > 4.5 3.8 3.2*  CL 100*  < > 96* 96* 99*  CO2 26  < > GLUCOSE 110*  < > 89 79 107*  BUN 18  < > 20 18 20  CREATININE 0.73  < > 0.50 0.42* 0.48  CALCIUM 8.4*  < > 8.5* 8.5* 8.8*  MG 1.8  --   --   --   --   PHOS 3.0  --   --   --   --   < > = values in this interval not displayed.  Recent Labs  10/09/15 0730 10/12/15 1620 10/23/15 1039  AST 19 20 27   ALT 14 14 18   ALKPHOS 74 73 61  BILITOT 1.0 0.9 0.7  PROT 5.8* 5.6* 6.1*  ALBUMIN 3.0* 3.0* 3.2*    Recent Labs  10/02/15 0400 10/09/15 0730 10/12/15 1620 10/23/15 1039  WBC 5.2 7.6 5.6 6.5  NEUTROABS 3.9 6.1 4.2  --   HGB 8.4* 9.9* 9.5* 9.4*  HCT 26.0* 30.4* 30.0* 29.1*  MCV 79.9* 81.5 81.0 80.2  PLT 238 362 298 207   Lab Results  Component Value Date   TSH 1.817 09/15/2015   Lab Results  Component Value Date   HGBA1C 5.5 02/19/2015   Lab Results  Component Value Date   CHOL 240 (H) 02/19/2015   HDL 89 02/19/2015   LDLCALC 134 (H) 02/19/2015   TRIG 87 02/19/2015   CHOLHDL 2.7 02/19/2015    Significant Diagnostic Results in last 30 days:  No results found.  Assessment/Plan 1. Adult failure to thrive Increase Marinol  to 5 mg in the am, 2.5 mg at lunch and supper. Continue to encourage favorite foods, snacks. Hospice referral pending after discharge home.  2. Bilateral pneumonia Lungs clear, condition resolved. O2 continuous. Afebrile  Family/ staff Communication:   Total Time: 25 minutes  Documentation: 15 minutes  Face to Face: 10 minutes  Family/Phone:   Labs/tests ordered:  none  Medication list reviewed and assessed for continued appropriateness. Monthly medication orders reviewed and signed.  Brynda RimShannon H. Amiya Escamilla, NP-C Geriatrics [facility] [phone] [address] [place], [place] [address] Cell Phone (Mon-Fri 8am-5pm):  [phone] On Call:  [phone] & follow prompts after 5pm & weekends Office Phone:  [phone] Office Fax:  [phone]

## 2015-11-07 ENCOUNTER — Non-Acute Institutional Stay (SKILLED_NURSING_FACILITY): Payer: Medicare Other | Admitting: Gerontology

## 2015-11-07 DIAGNOSIS — J189 Pneumonia, unspecified organism: Secondary | ICD-10-CM | POA: Diagnosis not present

## 2015-11-07 DIAGNOSIS — R627 Adult failure to thrive: Secondary | ICD-10-CM | POA: Diagnosis not present

## 2015-11-07 NOTE — Progress Notes (Signed)
Location:      Place of Service:  SNF (31)  Provider: Lorenso QuarryShannon Keyli Duross, NP-C  PCP: Lynnea FerrierBERT J KLEIN III, MD Patient Care Team: Lynnea FerrierBert J Klein III, MD as PCP - General (Internal Medicine)  Extended Emergency Contact Information Primary Emergency Contact: Marilu FavreMcIver,James W Jr. Address: [address]          [place], [place] [address] [place] Home Phone: [phone] Mobile Phone: [phone] Relation: Son Secondary Emergency Contact: Georgiana ShoreFarrell,Danny          [place], [place] [address] [place] Home Phone: [phone] Relation: Friend  Code Status: DNR Goals of care:  Advanced Directive information Advanced Directives 09/15/2015  Does patient have an advance directive? No  Would patient like information on creating an advanced directive? -     Allergies  Allergen Reactions  . Calcitonin ([place])     Other reaction(s): Other (See Comments) Nasal irritation  . Codeine Nausea And Vomiting    Chief Complaint  Patient presents with  . Discharge Note    HPI:  82 y.o. female      Past Medical History:  Diagnosis Date  . Anemia   . Atrial fibrillation (HCC)   . CHF (congestive heart failure) (HCC)   . CVA (cerebral infarction)   . HLD (hyperlipidemia)   . PMR (polymyalgia rheumatica) (HCC)   . PVD (peripheral vascular disease) (HCC)   . S/P MVR (mitral valve replacement)     Past Surgical History:  Procedure Laterality Date  . CARDIAC VALVE REPLACEMENT        reports that she has quit smoking. Her smoking use included Cigarettes. She quit after 30.00 years of use. She does not have any smokeless tobacco history on file. She reports that she drinks about 4.2 oz of alcohol per week . Her drug history is not on file. Social History   Social History  . Marital status: Widowed    Spouse name: N/A  . Number of children: N/A  . Years of education: N/A   Occupational History  . Not on file.   Social History Main Topics  . Smoking status: Former  Smoker    Years: 30.00    Types: Cigarettes  . Smokeless tobacco: Not on file  . Alcohol use 4.2 oz/week    7 Glasses of wine per week  . Drug use: Unknown  . Sexual activity: Not on file   Other Topics Concern  . Not on file   Social History Narrative  . No narrative on file   Functional Status Survey:    Allergies  Allergen Reactions  . Calcitonin ([place])     Other reaction(s): Other (See Comments) Nasal irritation  . Codeine Nausea And Vomiting    Pertinent  Health Maintenance Due  Topic Date Due  . DEXA SCAN  10/20/1990  . PNA vac Low Risk Adult (1 of 2 - PCV13) 10/20/1990  . INFLUENZA VACCINE  10/23/2015    Medications:   Medication List       Accurate as of 11/07/15  3:36 PM. Always use your most recent med list.          ADVAIR DISKUS 250-50 MCG/DOSE Aepb Generic drug:  Fluticasone-Salmeterol   aspirin 81 MG EC tablet Take 1 tablet (81 mg total) by mouth daily.   atorvastatin 40 MG tablet Commonly known as:  LIPITOR Take 1 tablet (40 mg total) by mouth daily at 6 PM.   azithromycin 250 MG tablet Commonly known as:  UYQIHKVQQZITHROMAX  Take as directed   cefUROXime 500 MG tablet Commonly known as:  CEFTIN Take 1 tablet (500 mg total) by mouth 2 (two) times daily with a meal.   feeding supplement (ENSURE ENLIVE) Liqd Take 237 mLs by mouth 3 (three) times daily between meals.   ferrous sulfate 325 (65 FE) MG tablet Take 1 tablet (325 mg total) by mouth 2 (two) times daily with a meal.   furosemide 20 MG tablet Commonly known as:  LASIX Take 1 tablet (20 mg total) by mouth every other day.   ipratropium-albuterol 0.5-2.5 (3) MG/3ML Soln Commonly known as:  DUONEB Take 3 mLs by nebulization 3 (three) times daily.   pantoprazole 40 MG tablet Commonly known as:  PROTONIX Take 1 tablet (40 mg total) by mouth daily.   timolol 0.5 % ophthalmic solution Commonly known as:  TIMOPTIC Place 1 drop into both eyes daily.       Review of Systems    Constitutional: Positive for appetite change and fatigue. Negative for activity change, chills, diaphoresis and fever.  HENT: Negative for congestion, rhinorrhea, sinus pressure, sneezing, sore throat, trouble swallowing and voice change.   Eyes: Negative for pain, redness and visual disturbance.  Respiratory: Negative for apnea, cough, choking, chest tightness, shortness of breath and wheezing.   Cardiovascular: Negative for chest pain, palpitations and leg swelling.  Gastrointestinal: Negative for abdominal distention, abdominal pain, constipation, diarrhea and nausea.  Genitourinary: Negative for difficulty urinating, dysuria, frequency and urgency.  Musculoskeletal: Positive for arthralgias (typical arthritis). Negative for back pain, gait problem and myalgias.  Skin: Negative for color change, pallor, rash and wound.  Neurological: Negative for dizziness, tremors, syncope, speech difficulty, weakness, numbness and headaches.  Psychiatric/Behavioral: Negative for agitation and behavioral problems.  All other systems reviewed and are negative.   Vitals:   11/07/15 1529  BP: 105/61  Pulse: (!) 108  Resp: (!) 24  Temp: 97.5 F (36.4 C)  SpO2: 90%  Weight: 97 lb 3.2 oz (44.1 kg)   Body mass index is 18.98 kg/m. Physical Exam  Constitutional: She is oriented to person, place, and time. Vital signs are normal. She appears well-developed. She appears cachectic. She is active and cooperative. She has a sickly appearance. No distress.  HENT:  Head: Normocephalic and atraumatic.  Mouth/Throat: Uvula is midline and oropharynx is clear and moist. Mucous membranes are pale, dry and not cyanotic.  Eyes: Conjunctivae, EOM and lids are normal. Pupils are equal, round, and reactive to light.  Neck: Trachea normal, normal range of motion and full passive range of motion without pain. Neck supple. No JVD present. No tracheal deviation, no edema and no erythema present. No thyromegaly present.   Cardiovascular: Normal rate, regular rhythm, normal heart sounds, intact distal pulses and normal pulses.  Exam reveals no gallop, no distant heart sounds and no friction rub.   No murmur heard. Pulmonary/Chest: Effort normal and breath sounds normal. No accessory muscle usage. No respiratory distress. She has no wheezes. She has no rales. She exhibits no tenderness.  Abdominal: Soft. Normal appearance and bowel sounds are normal. She exhibits no distension and no ascites. There is no tenderness. There is no guarding.  Musculoskeletal: Normal range of motion. She exhibits no edema or tenderness.       Thoracic back: She exhibits deformity (kyphosis).  Expected osteoarthritis, stiffness  Neurological: She is alert and oriented to person, place, and time. She has normal strength. She is not disoriented.  Skin: Skin is warm, dry and intact.  Petechiae noted. No rash noted. She is not diaphoretic. No cyanosis or erythema. No pallor. Nails show no clubbing.     Psychiatric: She has a normal mood and affect. Her speech is normal and behavior is normal. Judgment and thought content normal. Cognition and memory are normal.  Nursing note and vitals reviewed.   Labs reviewed: Basic Metabolic Panel:  Recent Labs  78/29/5611/26/16 1048  10/09/15 0730 10/12/15 1620 10/23/15 1039  NA 135  < > 133* 134* 140  K 3.8  < > 4.5 3.8 3.2*  CL 100*  < > 96* 96* 99*  CO2 26  < > 26 27 31   GLUCOSE 110*  < > 89 79 107*  BUN 18  < > 20 18 20   CREATININE 0.73  < > 0.50 0.42* 0.48  CALCIUM 8.4*  < > 8.5* 8.5* 8.8*  MG 1.8  --   --   --   --   PHOS 3.0  --   --   --   --   < > = values in this interval not displayed. Liver Function Tests:  Recent Labs  10/09/15 0730 10/12/15 1620 10/23/15 1039  AST 19 20 27   ALT 14 14 18   ALKPHOS 74 73 61  BILITOT 1.0 0.9 0.7  PROT 5.8* 5.6* 6.1*  ALBUMIN 3.0* 3.0* 3.2*    Recent Labs  02/17/15 1048  LIPASE 31   No results for input(s): AMMONIA in the last 8760  hours. CBC:  Recent Labs  10/02/15 0400 10/09/15 0730 10/12/15 1620 10/23/15 1039  WBC 5.2 7.6 5.6 6.5  NEUTROABS 3.9 6.1 4.2  --   HGB 8.4* 9.9* 9.5* 9.4*  HCT 26.0* 30.4* 30.0* 29.1*  MCV 79.9* 81.5 81.0 80.2  PLT 238 362 298 207   Cardiac Enzymes:  Recent Labs  09/15/15 1443 09/15/15 2039 09/16/15 0311  TROPONINI <0.03 0.04* 0.04*   BNP: Invalid input(s): POCBNP CBG: No results for input(s): GLUCAP in the last 8760 hours.  Procedures and Imaging Studies During Stay: No results found.  Assessment/Plan:   1. Adult failure to thrive Continue Appetite stimulant. Continue to offer favorite foods, snacks, etc.   2. Bilateral pneumonia Lungs clear, condition resolved. O2 continuous. Afebrile   Patient is being discharged with the following home health services: Hospice   Patient is being discharged with the following durable medical equipment:  Hospital bed, wheelchair, oxygen  Patient has been advised to f/u with their PCP in 1-2 weeks to bring them up to date on their rehab stay.  Social services at facility was responsible for arranging this appointment.  Pt was provided with a 30 day supply of prescriptions for medications and refills must be obtained from their PCP.  For controlled substances, a more limited supply may be provided adequate until PCP appointment only.  Future labs/tests needed:  Per PCP  Family/ staff Communication:   Total Time: 25 minutes  Documentation: 15 minutes  Face to Face: 10 minutes  Family/Phone:  Brynda RimShannon H. Dracen Reigle, NP-C Geriatrics [facility] [address] [place], [place] [address] Cell Phone (Mon-Fri 8am-5pm):  [phone] On Call:  [phone] & follow prompts after 5pm & weekends Office Phone:  [phone] Office Fax:  [phone]

## 2015-12-23 DEATH — deceased

## 2017-02-28 IMAGING — CR DG CHEST 2V
2 series · 2 of 2 positions shown · non-contrast
Comparison: 02/22/2015

CLINICAL DATA: Progressive shortness of breath and hypoxia.

EXAM:
CHEST  2 VIEW

[chest pa]
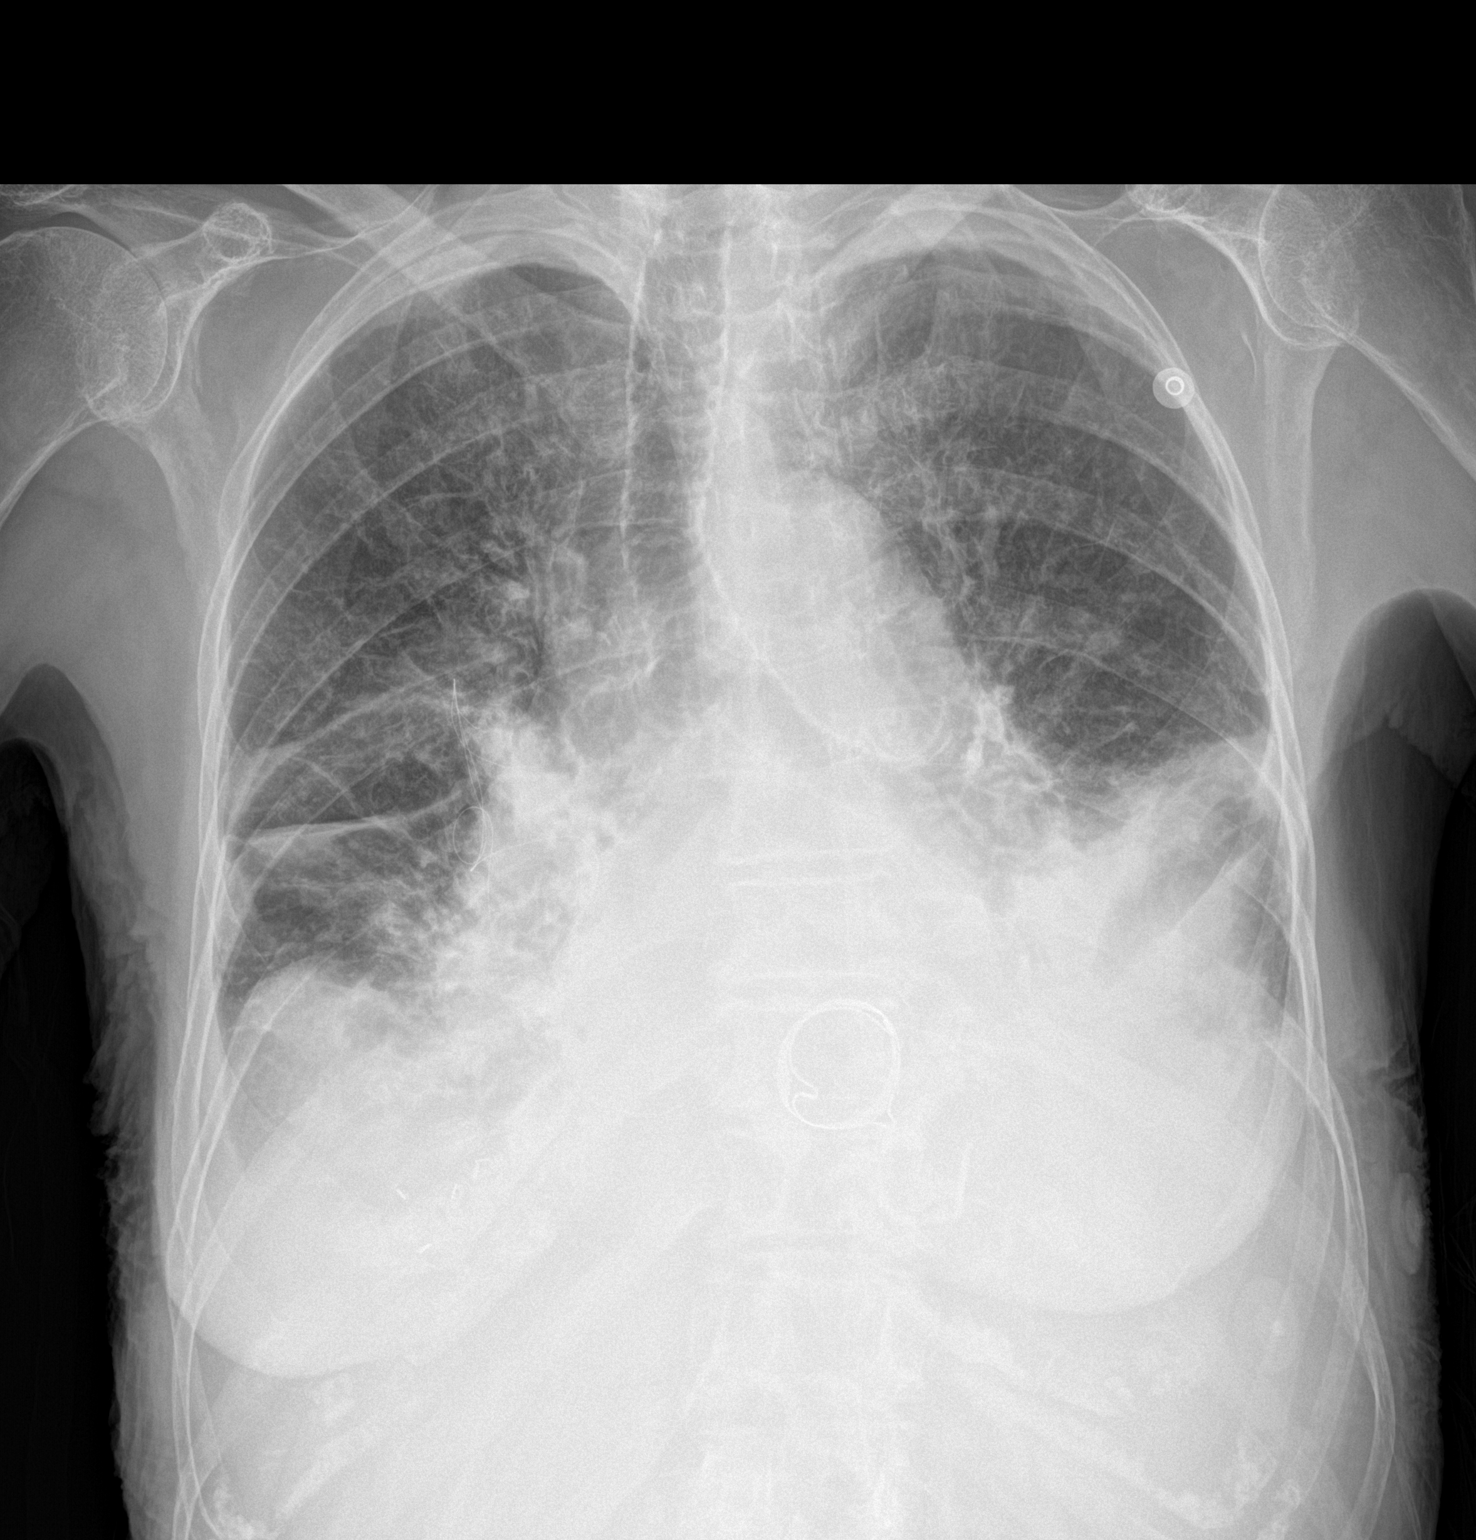

[chest lat]
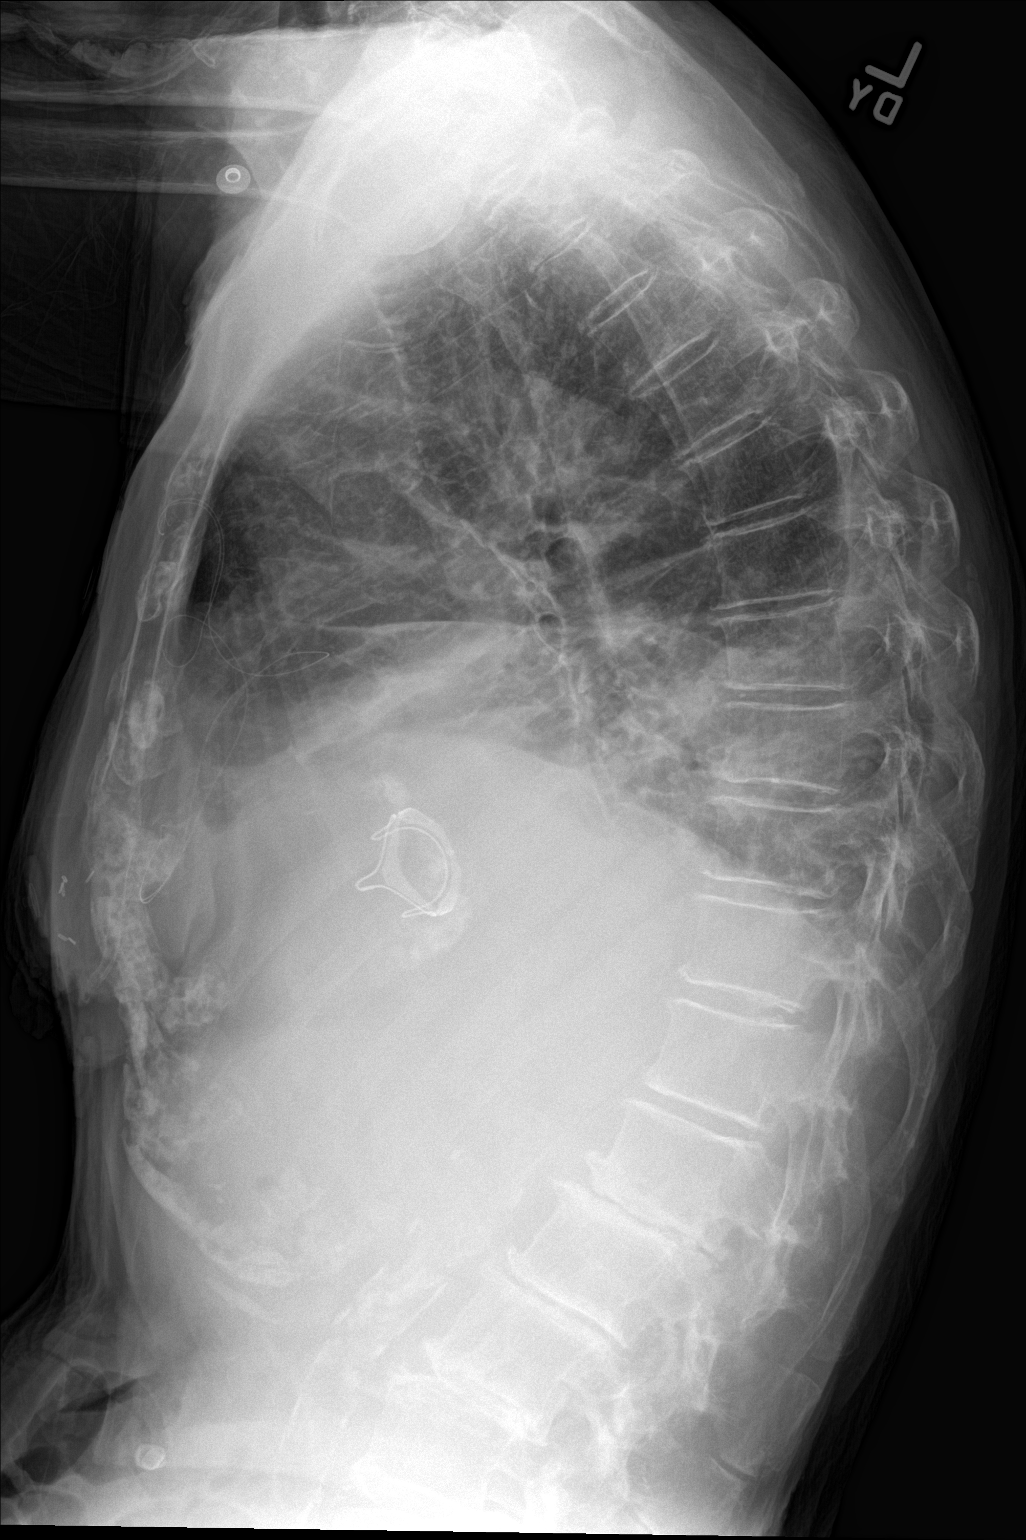

[2 of 2 positions shown; findings below may reference images not displayed]

FINDINGS: Interstitial and pulmonary vascular prominence present as well as
bibasilar opacities. Findings are suggestive of interstitial edema
as well as potentially additional infiltrates in both lower lung
zones. Small pleural effusions are suspected. The heart size appears
stable with evidence of a prosthetic mitral valve. Bony structures
show mild spondylosis of the thoracic and lumbar spine.
Atherosclerotic calcifications are visible in the abdominal aorta.
IMPRESSION: 1. Findings suggestive of interstitial edema with small bilateral
pleural effusions. There may be additional infiltrates in both lower
lung zones.
2. Aortic atherosclerosis.
